# Patient Record
Sex: Male | Born: 1955 | Race: White | Hispanic: No | Marital: Single | Smoking: Former smoker
Health system: Southern US, Community
[De-identification: ages and names within clinical notes are randomized; demographics above are authoritative.]

## PROBLEM LIST (undated history)

## (undated) DIAGNOSIS — M1990 Unspecified osteoarthritis, unspecified site: Secondary | ICD-10-CM

## (undated) DIAGNOSIS — J309 Allergic rhinitis, unspecified: Secondary | ICD-10-CM

## (undated) DIAGNOSIS — K219 Gastro-esophageal reflux disease without esophagitis: Secondary | ICD-10-CM

## (undated) DIAGNOSIS — J45909 Unspecified asthma, uncomplicated: Secondary | ICD-10-CM

## (undated) DIAGNOSIS — G5601 Carpal tunnel syndrome, right upper limb: Secondary | ICD-10-CM

## (undated) DIAGNOSIS — L309 Dermatitis, unspecified: Secondary | ICD-10-CM

## (undated) DIAGNOSIS — L509 Urticaria, unspecified: Secondary | ICD-10-CM

## (undated) DIAGNOSIS — F32A Depression, unspecified: Secondary | ICD-10-CM

## (undated) DIAGNOSIS — M5126 Other intervertebral disc displacement, lumbar region: Secondary | ICD-10-CM

## (undated) DIAGNOSIS — F329 Major depressive disorder, single episode, unspecified: Secondary | ICD-10-CM

## (undated) HISTORY — PX: ANKLE SURGERY: SHX546

## (undated) HISTORY — PX: CHOLECYSTECTOMY: SHX55

## (undated) HISTORY — DX: Depression, unspecified: F32.A

## (undated) HISTORY — DX: Unspecified asthma, uncomplicated: J45.909

## (undated) HISTORY — DX: Major depressive disorder, single episode, unspecified: F32.9

## (undated) HISTORY — DX: Allergic rhinitis, unspecified: J30.9

## (undated) HISTORY — DX: Other intervertebral disc displacement, lumbar region: M51.26

## (undated) HISTORY — DX: Urticaria, unspecified: L50.9

## (undated) HISTORY — DX: Gastro-esophageal reflux disease without esophagitis: K21.9

---

## 2001-01-05 ENCOUNTER — Ambulatory Visit (HOSPITAL_BASED_OUTPATIENT_CLINIC_OR_DEPARTMENT_OTHER): Admission: RE | Admit: 2001-01-05 | Discharge: 2001-01-05 | Payer: Self-pay | Admitting: *Deleted

## 2002-02-05 ENCOUNTER — Encounter: Payer: Self-pay | Admitting: Family Medicine

## 2002-02-05 ENCOUNTER — Encounter: Admission: RE | Admit: 2002-02-05 | Discharge: 2002-02-05 | Payer: Self-pay | Admitting: Family Medicine

## 2005-06-10 ENCOUNTER — Ambulatory Visit: Payer: Self-pay

## 2010-09-26 ENCOUNTER — Emergency Department (HOSPITAL_COMMUNITY): Payer: PRIVATE HEALTH INSURANCE

## 2010-09-26 ENCOUNTER — Ambulatory Visit (HOSPITAL_COMMUNITY)
Admission: EM | Admit: 2010-09-26 | Discharge: 2010-09-27 | Discharge status: Against Medical Advice | Payer: PRIVATE HEALTH INSURANCE | Attending: Emergency Medicine | Admitting: Emergency Medicine

## 2010-09-26 DIAGNOSIS — IMO0002 Reserved for concepts with insufficient information to code with codable children: Secondary | ICD-10-CM | POA: Insufficient documentation

## 2010-09-26 DIAGNOSIS — K449 Diaphragmatic hernia without obstruction or gangrene: Secondary | ICD-10-CM | POA: Insufficient documentation

## 2010-09-26 DIAGNOSIS — T18108A Unspecified foreign body in esophagus causing other injury, initial encounter: Secondary | ICD-10-CM | POA: Insufficient documentation

## 2010-09-26 LAB — DIFFERENTIAL
Basophils Absolute: 0 10*3/uL (ref 0.0–0.1)
Basophils Relative: 0 % (ref 0–1)
Eosinophils Absolute: 0.1 10*3/uL (ref 0.0–0.7)
Eosinophils Relative: 1 % (ref 0–5)
Lymphocytes Relative: 9 % — ABNORMAL LOW (ref 12–46)
Lymphs Abs: 1 10*3/uL (ref 0.7–4.0)
Monocytes Absolute: 0.5 10*3/uL (ref 0.1–1.0)
Monocytes Relative: 5 % (ref 3–12)
Neutro Abs: 9.7 10*3/uL — ABNORMAL HIGH (ref 1.7–7.7)
Neutrophils Relative %: 86 % — ABNORMAL HIGH (ref 43–77)

## 2010-09-26 LAB — URINE MICROSCOPIC-ADD ON

## 2010-09-26 LAB — URINALYSIS, ROUTINE W REFLEX MICROSCOPIC
Glucose, UA: NEGATIVE mg/dL
Ketones, ur: 15 mg/dL — AB
Leukocytes, UA: NEGATIVE
Nitrite: NEGATIVE
Protein, ur: NEGATIVE mg/dL
Specific Gravity, Urine: 1.028 (ref 1.005–1.030)
Urobilinogen, UA: 0.2 mg/dL (ref 0.0–1.0)
pH: 5 (ref 5.0–8.0)

## 2010-09-26 LAB — CBC
HCT: 44.3 % (ref 39.0–52.0)
Hemoglobin: 15.5 g/dL (ref 13.0–17.0)
MCH: 30.2 pg (ref 26.0–34.0)
MCHC: 35 g/dL (ref 30.0–36.0)
MCV: 86.2 fL (ref 78.0–100.0)
Platelets: 262 10*3/uL (ref 150–400)
RBC: 5.14 MIL/uL (ref 4.22–5.81)
RDW: 12.8 % (ref 11.5–15.5)
WBC: 11.3 10*3/uL — ABNORMAL HIGH (ref 4.0–10.5)

## 2010-09-26 LAB — COMPREHENSIVE METABOLIC PANEL
Albumin: 4.4 g/dL (ref 3.5–5.2)
Alkaline Phosphatase: 77 U/L (ref 39–117)
BUN: 26 mg/dL — ABNORMAL HIGH (ref 6–23)
Calcium: 9.5 mg/dL (ref 8.4–10.5)
Glucose, Bld: 98 mg/dL (ref 70–99)
Potassium: 3.8 mEq/L (ref 3.5–5.1)
Sodium: 139 mEq/L (ref 135–145)
Total Protein: 8.3 g/dL (ref 6.0–8.3)

## 2010-09-26 LAB — LIPASE, BLOOD: Lipase: 17 U/L (ref 11–59)

## 2010-09-29 NOTE — Op Note (Signed)
NAMEJEZIEL, HOFFMANN NO.:  [id number]  MEDICAL RECORD NO.:  [id number]           PATIENT TYPE:  E  LOCATION:  [facility]                         FACILITY:  [facility]  PHYSICIAN:  Petra Kuba, M.D.    DATE OF BIRTH:  [birth year]  DATE OF PROCEDURE:  09/26/2010 DATE OF DISCHARGE:                              OPERATIVE REPORT   SURGEON:  Petra Kuba, MD  PROCEDURE:  EGD with food disimpaction.  INDICATION:  Obvious food impaction.  Consent was signed after risks, benefits, methods, options thoroughly discussed with the patient and his wife.  MEDICINES USED: 1. Fentanyl 100 mcg. 2. Versed 10 mg.  PROCEDURE:  The videoendoscope was inserted by direct vision, obvious food was seen in the distal esophagus.  We initially snared the food bolus in multiple places, but in trying to tighten it and remove it, we just cut through it, seemed to be fairly crumbly and by his history, he has been in for 24 hours.  After multiple snares and cutting through, we went ahead and advanced the tripod grabbers and again good break food, but could not grab any pieces.  Finally, we seems to get a small piece fairly close to the GE junction and this was removed by withdrawing up to the scope and suctioning onto the scope.  With the scope, a small piece was removed.  When we inserted the scope, there was some food debris in the esophagus, but the GE junction was patent.  There was some spasm at the GE junction with a small hiatal hernia and a widely patent ring was seen.  The head of his bed was elevated and the rest of the food was easily washed into the stomach.  Water was suctioned from the stomach.  The scope was advanced through a normal antrum, normal pylorus into a normal duodenal bulb around the C-loop to a normal second portion of the duodenum.  Scope was withdrawn back to the stomach.  We did not inflate it completely with air but on quick retroflexion, no obvious cardiac lesion  was seen and no blood was seen coming from above.  Very quick straight visualization of the stomach without air insufflation, did not reveal any obvious problems, multiple food boluses and pieces were seen in the stomach.  At this juncture, the esophagus was normal except for the hiatal hernia and the widely patent ring.  There was no food left in the esophagus.  There was no obvious trauma signs or bleeding or tears.  The scope was removed.  The patient tolerated the procedure well.  There was no obvious immediate complication.  ENDOSCOPIC DIAGNOSES: 1. Obvious food impaction. 2. Broke the food impaction with multiple snares and tripod grabbers     into small pieces with one small piece being removed. 3. Once broken, all passed into the stomach. 4. He did have a small hiatal hernia with some gastroesophageal     junction spasm and a widely patent ring. 5. Otherwise, no obvious findings and exam to the second portion of     the duodenum without maximum air insufflation.  PLAN:  B.i.d. pump inhibitors.  No aspirin or nonsteroidals for 3 days. Clear liquids for 12 hours and then soft solids for 24.  Follow up p.r.n. or in 2 weeks to recheck symptoms and then decide if the dilation is needed.  Have him call us sooner p.r.n.          ______________________________ Petra Kuba, M.D.     MEM/MEDQ  D:  09/27/2010  T:  09/27/2010  Job:  [id number]  Electronically Signed by Vida Rigger M.D. on 09/29/2010 01:29:33 PM

## 2010-10-12 ENCOUNTER — Other Ambulatory Visit: Payer: Self-pay | Admitting: Gastroenterology

## 2010-10-13 ENCOUNTER — Ambulatory Visit
Admission: RE | Admit: 2010-10-13 | Discharge: 2010-10-13 | Disposition: A | Discharge status: Home / Self Care | Payer: PRIVATE HEALTH INSURANCE | Source: Ambulatory Visit | Attending: Gastroenterology | Admitting: Gastroenterology

## 2010-10-13 MED ORDER — IOHEXOL 300 MG/ML  SOLN
100.0000 mL | Freq: Once | INTRAMUSCULAR | Status: AC | PRN
  Administered 2010-10-13: 100 mL via INTRAVENOUS

## 2011-03-01 ENCOUNTER — Other Ambulatory Visit: Payer: Self-pay | Admitting: Family Medicine

## 2011-03-01 DIAGNOSIS — R911 Solitary pulmonary nodule: Secondary | ICD-10-CM

## 2011-04-18 ENCOUNTER — Ambulatory Visit
Admission: RE | Admit: 2011-04-18 | Discharge: 2011-04-18 | Disposition: A | Discharge status: Home / Self Care | Payer: PRIVATE HEALTH INSURANCE | Source: Ambulatory Visit | Attending: Family Medicine | Admitting: Family Medicine

## 2011-04-18 DIAGNOSIS — R911 Solitary pulmonary nodule: Secondary | ICD-10-CM

## 2011-04-18 MED ORDER — IOHEXOL 300 MG/ML  SOLN
75.0000 mL | Freq: Once | INTRAMUSCULAR | Status: AC | PRN
  Administered 2011-04-18: 75 mL via INTRAVENOUS

## 2012-09-17 ENCOUNTER — Other Ambulatory Visit: Payer: Self-pay | Admitting: Physician Assistant

## 2012-09-17 DIAGNOSIS — R911 Solitary pulmonary nodule: Secondary | ICD-10-CM

## 2012-10-04 ENCOUNTER — Other Ambulatory Visit: Payer: PRIVATE HEALTH INSURANCE

## 2013-10-02 ENCOUNTER — Other Ambulatory Visit: Payer: Self-pay | Admitting: Physician Assistant

## 2013-10-02 DIAGNOSIS — R911 Solitary pulmonary nodule: Secondary | ICD-10-CM

## 2013-10-07 ENCOUNTER — Encounter (INDEPENDENT_AMBULATORY_CARE_PROVIDER_SITE_OTHER): Payer: Self-pay

## 2013-10-07 ENCOUNTER — Ambulatory Visit
Admission: RE | Admit: 2013-10-07 | Discharge: 2013-10-07 | Disposition: A | Discharge status: Home / Self Care | Payer: 59 | Source: Ambulatory Visit | Attending: Physician Assistant | Admitting: Physician Assistant

## 2013-10-07 DIAGNOSIS — R911 Solitary pulmonary nodule: Secondary | ICD-10-CM

## 2013-10-07 MED ORDER — IOHEXOL 300 MG/ML  SOLN
75.0000 mL | Freq: Once | INTRAMUSCULAR | Status: AC | PRN
  Administered 2013-10-07: 75 mL via INTRAVENOUS

## 2015-01-08 DIAGNOSIS — K219 Gastro-esophageal reflux disease without esophagitis: Secondary | ICD-10-CM | POA: Insufficient documentation

## 2015-01-08 DIAGNOSIS — H1010 Acute atopic conjunctivitis, unspecified eye: Secondary | ICD-10-CM | POA: Insufficient documentation

## 2015-01-08 DIAGNOSIS — J309 Allergic rhinitis, unspecified: Secondary | ICD-10-CM | POA: Insufficient documentation

## 2015-01-08 DIAGNOSIS — L209 Atopic dermatitis, unspecified: Secondary | ICD-10-CM | POA: Insufficient documentation

## 2015-01-08 DIAGNOSIS — J4540 Moderate persistent asthma, uncomplicated: Secondary | ICD-10-CM | POA: Insufficient documentation

## 2015-01-08 DIAGNOSIS — L500 Allergic urticaria: Secondary | ICD-10-CM | POA: Insufficient documentation

## 2015-01-09 ENCOUNTER — Other Ambulatory Visit: Payer: Self-pay

## 2015-01-09 MED ORDER — MEPOLIZUMAB 100 MG ~~LOC~~ SOLR
300.0000 mg | SUBCUTANEOUS | Status: DC
  Administered 2015-02-27: 300 mg via SUBCUTANEOUS

## 2015-01-30 ENCOUNTER — Ambulatory Visit (INDEPENDENT_AMBULATORY_CARE_PROVIDER_SITE_OTHER): Payer: Commercial Managed Care - HMO | Admitting: *Deleted

## 2015-01-30 DIAGNOSIS — J309 Allergic rhinitis, unspecified: Secondary | ICD-10-CM

## 2015-01-30 DIAGNOSIS — L501 Idiopathic urticaria: Secondary | ICD-10-CM

## 2015-01-30 MED ORDER — OMALIZUMAB 150 MG ~~LOC~~ SOLR
300.0000 mg | SUBCUTANEOUS | Status: DC
  Administered 2015-01-30 – 2015-04-03 (×2): 300 mg via SUBCUTANEOUS

## 2015-02-27 ENCOUNTER — Ambulatory Visit (INDEPENDENT_AMBULATORY_CARE_PROVIDER_SITE_OTHER): Payer: Commercial Managed Care - HMO

## 2015-02-27 DIAGNOSIS — L500 Allergic urticaria: Secondary | ICD-10-CM | POA: Diagnosis not present

## 2015-04-02 ENCOUNTER — Ambulatory Visit: Payer: Commercial Managed Care - HMO | Admitting: Allergy and Immunology

## 2015-04-03 ENCOUNTER — Encounter: Payer: Self-pay | Admitting: Allergy and Immunology

## 2015-04-03 ENCOUNTER — Ambulatory Visit (INDEPENDENT_AMBULATORY_CARE_PROVIDER_SITE_OTHER): Payer: Commercial Managed Care - HMO | Admitting: Allergy and Immunology

## 2015-04-03 VITALS — BP 112/92 | HR 84 | Resp 14 | Ht 68.9 in | Wt 180.8 lb

## 2015-04-03 DIAGNOSIS — J4540 Moderate persistent asthma, uncomplicated: Secondary | ICD-10-CM

## 2015-04-03 DIAGNOSIS — J309 Allergic rhinitis, unspecified: Secondary | ICD-10-CM | POA: Diagnosis not present

## 2015-04-03 DIAGNOSIS — H1010 Acute atopic conjunctivitis, unspecified eye: Secondary | ICD-10-CM | POA: Diagnosis not present

## 2015-04-03 DIAGNOSIS — K219 Gastro-esophageal reflux disease without esophagitis: Secondary | ICD-10-CM

## 2015-04-03 DIAGNOSIS — L500 Allergic urticaria: Secondary | ICD-10-CM

## 2015-04-03 DIAGNOSIS — L209 Atopic dermatitis, unspecified: Secondary | ICD-10-CM

## 2015-04-03 MED ORDER — OMALIZUMAB 150 MG ~~LOC~~ SOLR
300.0000 mg | SUBCUTANEOUS | Status: DC
  Administered 2015-05-14 – 2016-06-01 (×14): 300 mg via SUBCUTANEOUS

## 2015-04-03 MED ORDER — METHYLPREDNISOLONE ACETATE 80 MG/ML IJ SUSP
80.0000 mg | Freq: Once | INTRAMUSCULAR | Status: AC
  Administered 2015-04-03: 80 mg via INTRAMUSCULAR

## 2015-04-03 NOTE — Progress Notes (Signed)
[facility]  Follow-up Note  Refering Provider: No ref. provider found Primary Provider: Arlyss Queen  Subjective:   Douglas Carroll is a 59 y.o. male who [facility] in re-evaluation of the following:  HPI Comments:  Douglas Carroll returns to this clinic on Tuesday December 2016 in reevaluation of his multiorgan atopic disease. The big issue for Douglas Carroll is that over the course of the past week if not a little bit longer he's had a dramatic flare of his skin condition. He has red blotchy areas over his body that are intensely itchy and he thinks his eczema starting to flare. He has no problems with his chest. Specifically he has no wheezing or coughing or shortness of breath and does not need to use a bronchodilator. He has no problems with his nose area reflux is not been a problem. He is not using his [place] and does not use a nasal steroid but has been pretty consistent about using Protopic to areas of eczema and he continues on Xolair. He's had 3 injections of Xolair so far but he does not think that he continue to use this medication based upon the cost of the injection. He does have a co-pay plan to pay for the medication and it costs him $5 per injection but the administration fee is quite high.   Outpatient Encounter Prescriptions as of 04/03/2015  Medication Sig  . albuterol (VENTOLIN HFA) 108 (90 BASE) MCG/ACT inhaler Inhale 2 puffs into the lungs every 4 (four) hours as needed for wheezing or shortness of breath.  . EPINEPHrine (EPIPEN 2-PAK) 0.3 mg/0.3 mL IJ SOAJ injection Inject 0.3 mg into the muscle once.  . loratadine (CLARITIN) 10 MG tablet Take 10 mg by mouth 2 (two) times daily.  . mometasone (ELOCON) 0.1 % ointment Apply 1 application topically daily as needed.  . montelukast (SINGULAIR) 10 MG tablet Take 10 mg by mouth at bedtime.  Marland Kitchen omalizumab (XOLAIR) 150 MG injection Inject 300 mg into the  skin every 28 (twenty-eight) days.  Marland Kitchen omeprazole (PRILOSEC) 20 MG capsule Take 20 mg by mouth daily.  . pravastatin (PRAVACHOL) 80 MG tablet Take 80 mg by mouth daily.  . sertraline (ZOLOFT) 50 MG tablet   . budesonide (RHINOCORT ALLERGY) 32 MCG/ACT nasal spray Place into both nostrils daily.  . Fluticasone Furoate-Vilanterol (BREO ELLIPTA) 100-25 MCG/INH AEPB Inhale 1 puff into the lungs daily.   Facility-Administered Encounter Medications as of 04/03/2015  Medication  . [COMPLETED] methylPREDNISolone acetate (DEPO-MEDROL) injection 80 mg  . omalizumab Geoffry Paradise) injection 300 mg  . [DISCONTINUED] Mepolizumab SOLR 300 mg  . [DISCONTINUED] omalizumab Geoffry Paradise) injection 300 mg    Meds ordered this encounter  Medications  . methylPREDNISolone acetate (DEPO-MEDROL) injection 80 mg    Sig:   . omalizumab Geoffry Paradise) injection 300 mg    Sig:     Past Medical History  Diagnosis Date  . Asthma   . Urticaria   . Depression   . GERD (gastroesophageal reflux disease)   . Allergic rhinitis     Past Surgical History  Procedure Laterality Date  . Cholecystectomy      No Known Allergies  Review of Systems  Constitutional: Negative for fever, chills and fatigue.  HENT: Negative for congestion, ear pain, facial swelling, hearing loss, nosebleeds, postnasal drip, rhinorrhea, sinus pressure, sneezing, sore throat, tinnitus, trouble swallowing and voice change.   Eyes: Negative for pain, discharge, redness and  itching.  Respiratory: Negative for cough, chest tightness, shortness of breath and wheezing.   Cardiovascular: Negative for chest pain and leg swelling.  Gastrointestinal: Negative for nausea, vomiting and abdominal pain.  Endocrine: Negative for cold intolerance and heat intolerance.  Musculoskeletal: Negative for myalgias and arthralgias.  Skin: Positive for rash.  Allergic/Immunologic: Negative.   Neurological: Negative for dizziness and headaches.  Hematological: Negative for  adenopathy.     Objective:   Filed Vitals:   04/03/15 0837  BP: 112/92  Pulse: 84  Resp: 14   Height: 5' 8.9" (175 cm)  Weight: 180 lb 12.4 oz (82 kg)   Physical Exam  Constitutional: He appears well-developed and well-nourished. No distress.  HENT:  Head: Normocephalic and atraumatic. Head is without right periorbital erythema and without left periorbital erythema.  Right Ear: Tympanic membrane, external ear and ear canal normal. No drainage or tenderness. No foreign bodies. Tympanic membrane is not injected, not scarred, not perforated, not erythematous, not retracted and not bulging. No middle ear effusion.  Left Ear: Tympanic membrane, external ear and ear canal normal. No drainage or tenderness. No foreign bodies. Tympanic membrane is not injected, not scarred, not perforated, not erythematous, not retracted and not bulging.  No middle ear effusion.  Nose: Nose normal. No mucosal edema, rhinorrhea, nose lacerations or sinus tenderness.  No foreign bodies.  Mouth/Throat: Oropharynx is clear and moist. No oropharyngeal exudate, posterior oropharyngeal edema, posterior oropharyngeal erythema or tonsillar abscesses.  Eyes: Lids are normal. Right eye exhibits no chemosis, no discharge and no exudate. No foreign body present in the right eye. Left eye exhibits no chemosis, no discharge and no exudate. No foreign body present in the left eye. Right conjunctiva is not injected. Left conjunctiva is not injected.  Neck: Neck supple. No tracheal tenderness present. No tracheal deviation and no edema present. No thyroid mass and no thyromegaly present.  Cardiovascular: Normal rate, regular rhythm, S1 normal and S2 normal.  Exam reveals no gallop.   No murmur heard. Pulmonary/Chest: No accessory muscle usage or stridor. No respiratory distress. He has no wheezes. He has no rhonchi. He has no rales.  Abdominal: Soft.  Lymphadenopathy:       Head (right side): No tonsillar adenopathy present.        Head (left side): No tonsillar adenopathy present.    He has no cervical adenopathy.  Neurological: He is alert.  Skin: Rash noted. He is not diaphoretic.  Extensive erythematous blotchy lesions across his trunk and extremities and face along with lichenified and erythematous areas of his skin involving his antecubital fossa and neck.  Psychiatric: He has a normal mood and affect. His behavior is normal.    Diagnostics:   The patient had an Asthma Control Test with the following results: ACT Total Score: 21.    Assessment and Plan:   1. Moderate persistent asthma, uncomplicated   2. Allergic rhinoconjunctivitis   3. Atopic dermatitis   4. Allergic urticaria   5. Gastroesophageal reflux disease, esophagitis presence not specified      1. Depomedrol 80 IM now  2. Prednisone 10mg  tablet - two tablets one time a day for 10 days, then 1-1/2 tablet one time per day for 10 days, then one tablet one time a day for 10 days, then 1/2 tablet one time per day for 10 days.  3. Use a combination of mometasone 0.1% cream followed by Protopic 0.1% ointment two times per day.  4. Use Zyrtec 10 mg one tablet  two times per day  5. Continue Breo 100 one inhalation two times per day  6. Continue montelukast 10 mg one tablet one time per day  7. Continue omeprazole 20 mg one tablet one time per day  8. Continue OTC rhinocort one spray each nostril one time per day  9. Use Ventolin if needed.  10. Xolair today - no charge for injection delivery today  11. Return at end of prednisone.  [name]'s had a dramatic flare of his skin condition and we will treat him with the therapy mentioned above and regroup with him when he finishes his oral steroid. We did have a dose of his Xolair in the clinic today and I did administer this medication and we will not charge him for the injection delivery fee today. It did appear as though the administration of his Xolair was keeping his atopic disease under good  control.   Laurette Schimke, MD [facility]

## 2015-04-03 NOTE — Patient Instructions (Addendum)
  1. Depomedrol 80 IM now  2. Prednisone 10mg  tablet - two tablets one time a day for 10 days, then 1-1/2 tablet one time per day for 10 days, then one tablet one time a day for 10 days, then 1/2 tablet one time per day for 10 days.  3. Use a combination of mometasone o.1% cream followed by Protopic 0.1% ointment two times per day.  4. Use Zyrtec 10 mg one tablet two times per day  5. Continue Breo 100 one inhalation two times per day  6. Continue montelukast 10 mg one tablet one time per day  7. Continue omeprazole 20 mg one tablet one time per day  8. Continue OTC rhinocort one spray each nostril one time per day  9. Use Ventolin if needed.  10. Xolair today - no charge for injection delivery today  11. Return at end of prednisone.

## 2015-04-13 ENCOUNTER — Ambulatory Visit: Payer: Self-pay | Admitting: Allergy and Immunology

## 2015-05-14 ENCOUNTER — Encounter: Payer: Self-pay | Admitting: Allergy and Immunology

## 2015-05-14 ENCOUNTER — Ambulatory Visit (INDEPENDENT_AMBULATORY_CARE_PROVIDER_SITE_OTHER): Payer: Commercial Managed Care - HMO | Admitting: Allergy and Immunology

## 2015-05-14 VITALS — BP 130/84 | HR 92 | Resp 18

## 2015-05-14 DIAGNOSIS — L209 Atopic dermatitis, unspecified: Secondary | ICD-10-CM

## 2015-05-14 DIAGNOSIS — H1010 Acute atopic conjunctivitis, unspecified eye: Secondary | ICD-10-CM

## 2015-05-14 DIAGNOSIS — K219 Gastro-esophageal reflux disease without esophagitis: Secondary | ICD-10-CM | POA: Diagnosis not present

## 2015-05-14 DIAGNOSIS — L500 Allergic urticaria: Secondary | ICD-10-CM

## 2015-05-14 DIAGNOSIS — J4540 Moderate persistent asthma, uncomplicated: Secondary | ICD-10-CM

## 2015-05-14 DIAGNOSIS — J309 Allergic rhinitis, unspecified: Secondary | ICD-10-CM | POA: Diagnosis not present

## 2015-05-14 NOTE — Patient Instructions (Addendum)
    1. Use a combination of mometasone o.1% cream followed by Protopic 0.1% ointment two times per day.  2. Use Zyrtec 10 mg one tablet two times per day  3. Continue Breo 100 one inhalation two times per day  4. Continue montelukast 10 mg one tablet one time per day  5. Continue omeprazole 20 mg one tablet one time per day  6. Continue OTC rhinocort one spray each nostril one time per day  7. Continue Xolair and EpiPen  8. Use Ventolin if needed.  9. Return in 6 months or earlier if problem

## 2015-05-14 NOTE — Progress Notes (Signed)
[facility]  Follow-up Note  Referring Provider: Lonie Peak, PA-C Primary Provider: Arlyss Queen Date of Office Visit: 05/14/2015  Subjective:   Douglas Carroll is a 60 y.o. male who [facility] the [facility] in re-evaluation of the following:  HPI Comments:  Douglas Carroll returns to this clinic on 05/14/2015 in reevaluation of his atopic dermatitis, allergic rhinitis, urticaria, and asthma. He is done quite well since I last seen him in this clinic and has resolved his flare. He no longer has much issue with his skin. He's not been consistently using his topical anti-inflammatory agents. His asthma has not been causing him any problem and he does not use a short acting bronchodilator. He's had no problems with his nose. He's been very good about continuing to use all of his previously prescribed medical therapy.   Current Outpatient Prescriptions on File Prior to Visit  Medication Sig Dispense Refill  . albuterol (VENTOLIN HFA) 108 (90 BASE) MCG/ACT inhaler Inhale 2 puffs into the lungs every 4 (four) hours as needed for wheezing or shortness of breath.    . EPINEPHrine (EPIPEN 2-PAK) 0.3 mg/0.3 mL IJ SOAJ injection Inject 0.3 mg into the muscle once.    . Fluticasone Furoate-Vilanterol (BREO ELLIPTA) 100-25 MCG/INH AEPB Inhale 1 puff into the lungs daily.    Marland Kitchen omalizumab (XOLAIR) 150 MG injection Inject 300 mg into the skin every 28 (twenty-eight) days.    Marland Kitchen omeprazole (PRILOSEC) 20 MG capsule Take 20 mg by mouth daily.    . pravastatin (PRAVACHOL) 80 MG tablet Take 80 mg by mouth daily.    . budesonide (RHINOCORT ALLERGY) 32 MCG/ACT nasal spray Place into both nostrils daily. Reported on 05/14/2015    . loratadine (CLARITIN) 10 MG tablet Take 10 mg by mouth 2 (two) times daily. Reported on 05/14/2015    . mometasone (ELOCON) 0.1 % ointment Apply 1 application topically daily as needed. Reported on 05/14/2015    .  montelukast (SINGULAIR) 10 MG tablet Take 10 mg by mouth at bedtime. Reported on 05/14/2015    . sertraline (ZOLOFT) 50 MG tablet Reported on 05/14/2015  1   Current Facility-Administered Medications on File Prior to Visit  Medication Dose Route Frequency Provider Last Rate Last Dose  . omalizumab Geoffry Paradise) injection 300 mg  300 mg Subcutaneous Q28 days Jessica Priest, MD        No orders of the defined types were placed in this encounter.    Past Medical History  Diagnosis Date  . Asthma   . Urticaria   . Depression   . GERD (gastroesophageal reflux disease)   . Allergic rhinitis     Past Surgical History  Procedure Laterality Date  . Cholecystectomy      No Known Allergies  Review of systems negative except as noted in HPI / PMHx or noted below:  Review of Systems  Constitutional: Negative.   HENT: Negative.   Eyes: Negative.   Respiratory: Negative.   Cardiovascular: Negative.   Gastrointestinal: Negative.   Genitourinary: Negative.   Musculoskeletal: Negative.   Skin: Negative.   Neurological: Negative.   Endo/Heme/Allergies: Negative.   Psychiatric/Behavioral: Negative.      Objective:   Filed Vitals:   05/14/15 1558  BP: 130/84  Pulse: 92  Resp: 18          Physical Exam  Constitutional: He is well-developed, well-nourished, and in no distress. No distress.  HENT:  Head:  Normocephalic.  Right Ear: Tympanic membrane, external ear and ear canal normal.  Left Ear: Tympanic membrane, external ear and ear canal normal.  Nose: Nose normal. No mucosal edema or rhinorrhea.  Mouth/Throat: Uvula is midline, oropharynx is clear and moist and mucous membranes are normal. No oropharyngeal exudate.  Eyes: Conjunctivae are normal.  Neck: Trachea normal. No tracheal tenderness present. No tracheal deviation present. No thyromegaly present.  Cardiovascular: Normal rate, regular rhythm, S1 normal, S2 normal and normal heart sounds.   No murmur  heard. Pulmonary/Chest: Breath sounds normal. No stridor. No respiratory distress. He has no wheezes. He has no rales.  Musculoskeletal: He exhibits no edema.  Lymphadenopathy:       Head (right side): No tonsillar adenopathy present.       Head (left side): No tonsillar adenopathy present.    He has no cervical adenopathy.    He has no axillary adenopathy.  Neurological: He is alert. Gait normal.  Skin: Rash (minimal erythema face and upper extremities) noted. He is not diaphoretic. No erythema. Nails show no clubbing.  Psychiatric: Mood and affect normal.    Diagnostics:    Spirometry was performed and demonstrated an FEV1 of 3.58 at 102 % of predicted.  The patient had an Asthma Control Test with the following results:  .    Assessment and Plan:   1. Moderate persistent asthma, uncomplicated   2. Allergic rhinoconjunctivitis   3. Atopic dermatitis   4. Allergic urticaria   5. Gastroesophageal reflux disease, esophagitis presence not specified       1. Use a combination of mometasone o.1% cream followed by Protopic 0.1% ointment two times per day.  2. Use Zyrtec 10 mg one tablet two times per day  3. Continue Breo 100 one inhalation two times per day  4. Continue montelukast 10 mg one tablet one time per day  5. Continue omeprazole 20 mg one tablet one time per day  6. Continue OTC rhinocort one spray each nostril one time per day  7. Continue Xolair and EpiPen  8. Use Ventolin if needed.  9. Return in 6 months or earlier if problem  Douglas HeckRandy has done well I encouraged him to consistently use his mometasone and Protopic especially if he finds any areas of his skin that are becoming inflamed. We'll continue to have him use the therapy mentioned above and see him back in this clinic in approximately 6 months or earlier if there is a problem.   Laurette SchimkeEric Tymara Saur, MD [facility]

## 2015-06-02 ENCOUNTER — Ambulatory Visit (INDEPENDENT_AMBULATORY_CARE_PROVIDER_SITE_OTHER): Payer: Commercial Managed Care - HMO

## 2015-06-02 ENCOUNTER — Ambulatory Visit: Payer: Commercial Managed Care - HMO | Admitting: Allergy and Immunology

## 2015-06-02 DIAGNOSIS — L500 Allergic urticaria: Secondary | ICD-10-CM | POA: Diagnosis not present

## 2015-06-02 DIAGNOSIS — J4540 Moderate persistent asthma, uncomplicated: Secondary | ICD-10-CM

## 2015-06-18 ENCOUNTER — Telehealth: Payer: Self-pay | Admitting: Allergy and Immunology

## 2015-06-18 NOTE — Telephone Encounter (Signed)
Please call patient back about payment that he wished to apply to a specific date and a payment he claims to have made "online" in the amount of $183.00.

## 2015-06-18 NOTE — Telephone Encounter (Signed)
Just sent pmt 2 days ago - gave me another payment to pay off acct

## 2015-06-30 ENCOUNTER — Ambulatory Visit (INDEPENDENT_AMBULATORY_CARE_PROVIDER_SITE_OTHER): Payer: Commercial Managed Care - HMO | Admitting: *Deleted

## 2015-06-30 DIAGNOSIS — J4540 Moderate persistent asthma, uncomplicated: Secondary | ICD-10-CM

## 2015-07-28 ENCOUNTER — Ambulatory Visit (INDEPENDENT_AMBULATORY_CARE_PROVIDER_SITE_OTHER): Payer: Commercial Managed Care - HMO

## 2015-07-28 DIAGNOSIS — L501 Idiopathic urticaria: Secondary | ICD-10-CM | POA: Diagnosis not present

## 2015-07-28 DIAGNOSIS — J4540 Moderate persistent asthma, uncomplicated: Secondary | ICD-10-CM

## 2015-08-28 ENCOUNTER — Ambulatory Visit (INDEPENDENT_AMBULATORY_CARE_PROVIDER_SITE_OTHER): Payer: Commercial Managed Care - HMO | Admitting: *Deleted

## 2015-08-28 DIAGNOSIS — L501 Idiopathic urticaria: Secondary | ICD-10-CM | POA: Diagnosis not present

## 2015-08-28 DIAGNOSIS — J4540 Moderate persistent asthma, uncomplicated: Secondary | ICD-10-CM

## 2015-08-28 DIAGNOSIS — L500 Allergic urticaria: Secondary | ICD-10-CM

## 2015-09-25 ENCOUNTER — Ambulatory Visit (INDEPENDENT_AMBULATORY_CARE_PROVIDER_SITE_OTHER): Payer: Commercial Managed Care - HMO | Admitting: *Deleted

## 2015-09-25 DIAGNOSIS — L500 Allergic urticaria: Secondary | ICD-10-CM

## 2015-10-30 ENCOUNTER — Ambulatory Visit (INDEPENDENT_AMBULATORY_CARE_PROVIDER_SITE_OTHER): Payer: Commercial Managed Care - HMO | Admitting: *Deleted

## 2015-10-30 DIAGNOSIS — J4540 Moderate persistent asthma, uncomplicated: Secondary | ICD-10-CM | POA: Diagnosis not present

## 2015-10-30 DIAGNOSIS — L501 Idiopathic urticaria: Secondary | ICD-10-CM

## 2015-11-11 ENCOUNTER — Ambulatory Visit: Payer: Commercial Managed Care - HMO | Admitting: Allergy and Immunology

## 2015-11-27 ENCOUNTER — Ambulatory Visit (INDEPENDENT_AMBULATORY_CARE_PROVIDER_SITE_OTHER): Payer: Commercial Managed Care - HMO | Admitting: *Deleted

## 2015-11-27 DIAGNOSIS — L500 Allergic urticaria: Secondary | ICD-10-CM

## 2015-12-25 ENCOUNTER — Ambulatory Visit (INDEPENDENT_AMBULATORY_CARE_PROVIDER_SITE_OTHER): Payer: Commercial Managed Care - HMO

## 2015-12-25 ENCOUNTER — Ambulatory Visit: Payer: Commercial Managed Care - HMO

## 2015-12-25 DIAGNOSIS — L500 Allergic urticaria: Secondary | ICD-10-CM | POA: Diagnosis not present

## 2016-01-22 ENCOUNTER — Ambulatory Visit: Payer: Commercial Managed Care - HMO

## 2016-01-22 ENCOUNTER — Ambulatory Visit (INDEPENDENT_AMBULATORY_CARE_PROVIDER_SITE_OTHER): Payer: Commercial Managed Care - HMO

## 2016-01-22 DIAGNOSIS — L501 Idiopathic urticaria: Secondary | ICD-10-CM

## 2016-01-22 DIAGNOSIS — J4540 Moderate persistent asthma, uncomplicated: Secondary | ICD-10-CM

## 2016-02-25 ENCOUNTER — Ambulatory Visit (INDEPENDENT_AMBULATORY_CARE_PROVIDER_SITE_OTHER): Payer: Commercial Managed Care - HMO | Admitting: *Deleted

## 2016-02-25 DIAGNOSIS — J4540 Moderate persistent asthma, uncomplicated: Secondary | ICD-10-CM

## 2016-02-25 DIAGNOSIS — L501 Idiopathic urticaria: Secondary | ICD-10-CM

## 2016-03-31 ENCOUNTER — Ambulatory Visit (INDEPENDENT_AMBULATORY_CARE_PROVIDER_SITE_OTHER): Payer: Commercial Managed Care - HMO

## 2016-03-31 ENCOUNTER — Ambulatory Visit: Payer: Commercial Managed Care - HMO

## 2016-03-31 DIAGNOSIS — J4540 Moderate persistent asthma, uncomplicated: Secondary | ICD-10-CM

## 2016-03-31 DIAGNOSIS — L500 Allergic urticaria: Secondary | ICD-10-CM | POA: Diagnosis not present

## 2016-04-28 ENCOUNTER — Ambulatory Visit: Payer: Commercial Managed Care - HMO

## 2016-04-28 ENCOUNTER — Ambulatory Visit (INDEPENDENT_AMBULATORY_CARE_PROVIDER_SITE_OTHER): Payer: Commercial Managed Care - HMO | Admitting: *Deleted

## 2016-04-28 DIAGNOSIS — L500 Allergic urticaria: Secondary | ICD-10-CM | POA: Diagnosis not present

## 2016-05-26 ENCOUNTER — Ambulatory Visit: Payer: Commercial Managed Care - HMO

## 2016-05-31 DIAGNOSIS — L501 Idiopathic urticaria: Secondary | ICD-10-CM | POA: Diagnosis not present

## 2016-06-01 ENCOUNTER — Ambulatory Visit (INDEPENDENT_AMBULATORY_CARE_PROVIDER_SITE_OTHER): Payer: Commercial Managed Care - HMO | Admitting: *Deleted

## 2016-06-01 DIAGNOSIS — L500 Allergic urticaria: Secondary | ICD-10-CM

## 2016-06-06 ENCOUNTER — Ambulatory Visit (INDEPENDENT_AMBULATORY_CARE_PROVIDER_SITE_OTHER): Payer: Commercial Managed Care - HMO | Admitting: Allergy and Immunology

## 2016-06-06 ENCOUNTER — Encounter: Payer: Self-pay | Admitting: Allergy and Immunology

## 2016-06-06 VITALS — BP 122/78 | HR 82 | Resp 14

## 2016-06-06 DIAGNOSIS — J309 Allergic rhinitis, unspecified: Secondary | ICD-10-CM | POA: Diagnosis not present

## 2016-06-06 DIAGNOSIS — L2089 Other atopic dermatitis: Secondary | ICD-10-CM

## 2016-06-06 DIAGNOSIS — H1010 Acute atopic conjunctivitis, unspecified eye: Secondary | ICD-10-CM

## 2016-06-06 DIAGNOSIS — K219 Gastro-esophageal reflux disease without esophagitis: Secondary | ICD-10-CM

## 2016-06-06 DIAGNOSIS — J4540 Moderate persistent asthma, uncomplicated: Secondary | ICD-10-CM | POA: Diagnosis not present

## 2016-06-06 NOTE — Progress Notes (Signed)
Follow-up Note  Referring Provider: Lonie Peakonroy, Nathan, PA-C Primary Provider: Lonie PeakNathan Conroy, PA-C Date of Office Visit: 06/06/2016  Subjective:   Douglas LawlessRobert R Demicco (DOB: January 09, 1956) is a 61 y.o. male [facility] in re-evaluation of the following:  HPI: Douglas HeckRandy returns to this clinic in reevaluation of his multiorgan atopic disease including asthma, or urticaria, atopic dermatitis, allergic rhinitis treated with Xolair. I've not seen him in this clinic since January 2017.  His atopic disease has melted away on Xolair. He no longer uses any medications for asthma or his allergic rhinitis and rarely uses any mometasone and Protopic at this point in time for his atopic dermatitis. He has not required a systemic steroid or an antibiotic since I've seen him in this clinic. He does not use a short acting bronchodilator and can exercise without any difficulty.  Xolair injections have been doing well but they are very expensive. He pays approximately $150 per injection.  His reflux is under excellent control as long as he continues on a proton pump inhibitor.  He did receive the flu vaccine this year.  Allergies as of 06/06/2016   No Known Allergies     Medication List      EPIPEN 2-PAK 0.3 mg/0.3 mL Soaj injection Generic drug:  EPINEPHrine Inject 0.3 mg into the muscle once.   loratadine 10 MG tablet Commonly known as:  CLARITIN Take 10 mg by mouth 2 (two) times daily. Reported on 05/14/2015   mometasone 0.1 % ointment Commonly known as:  ELOCON Apply 1 application topically daily as needed. Reported on 05/14/2015   omeprazole 20 MG capsule Commonly known as:  PRILOSEC Take 20 mg by mouth daily.   pravastatin 80 MG tablet Commonly known as:  PRAVACHOL Take 80 mg by mouth daily.   sertraline 50 MG tablet Commonly known as:  ZOLOFT Reported on 05/14/2015   tacrolimus 0.1 % ointment Commonly known as:  PROTOPIC Apply 1 application  topically 2 (two) times daily as needed.   VENTOLIN HFA 108 (90 Base) MCG/ACT inhaler Generic drug:  albuterol Inhale 2 puffs into the lungs every 4 (four) hours as needed for wheezing or shortness of breath.   XOLAIR 150 MG injection Generic drug:  omalizumab Inject 300 mg into the skin every 28 (twenty-eight) days.       Past Medical History:  Diagnosis Date  . Allergic rhinitis   . Asthma   . Depression   . GERD (gastroesophageal reflux disease)   . Urticaria     Past Surgical History:  Procedure Laterality Date  . CHOLECYSTECTOMY      Review of systems negative except as noted in HPI / PMHx or noted below:  Review of Systems  Constitutional: Negative.   HENT: Negative.   Eyes: Negative.   Respiratory: Negative.   Cardiovascular: Negative.   Gastrointestinal: Negative.   Genitourinary: Negative.   Musculoskeletal: Negative.   Skin: Negative.   Neurological: Negative.   Endo/Heme/Allergies: Negative.   Psychiatric/Behavioral: Negative.      Objective:   Vitals:   06/06/16 1530  BP: 122/78  Pulse: 82  Resp: 14          Physical Exam  Constitutional: He is well-developed, well-nourished, and in no distress.  HENT:  Head: Normocephalic.  Right Ear: Tympanic membrane, external ear and ear canal normal.  Left Ear: Tympanic membrane, external ear and ear canal normal.  Nose: Nose normal. No mucosal edema or rhinorrhea.  Mouth/Throat: Uvula is midline, oropharynx is clear and moist and mucous membranes are normal. No oropharyngeal exudate.  Eyes: Conjunctivae are normal.  Neck: Trachea normal. No tracheal tenderness present. No tracheal deviation present. No thyromegaly present.  Cardiovascular: Normal rate, regular rhythm, S1 normal, S2 normal and normal heart sounds.   No murmur heard. Pulmonary/Chest: Breath sounds normal. No stridor. No respiratory distress. He has no wheezes. He has no rales.  Musculoskeletal: He exhibits no edema.    Lymphadenopathy:       Head (right side): No tonsillar adenopathy present.       Head (left side): No tonsillar adenopathy present.    He has no cervical adenopathy.  Neurological: He is alert. Gait normal.  Skin: No rash noted. He is not diaphoretic. No erythema. Nails show no clubbing.  Psychiatric: Mood and affect normal.    Diagnostics:    Spirometry was performed and demonstrated an FEV1 of 3.23 at 93 % of predicted.  The patient had an Asthma Control Test with the following results:  .    Assessment and Plan:   1. Other atopic dermatitis   2. Moderate persistent asthma, uncomplicated   3. Allergic rhinoconjunctivitis   4. Gastroesophageal reflux disease, esophagitis presence not specified      1. Use a combination of mometasone o.1% cream followed by Protopic 0.1% ointment two times per day when needed.  2. Use Zyrtec 10 mg one tablet two times per day if needed  3. Use Ventolin if needed.  4. Continue Xolair and EpiPen  5. Continue omeprazole 20 mg one tablet one time per day  6. Return in 12 months or earlier if problem  7. Possible Dupilumab to replace Xolair?   Douglas [facility] has had an excellent response to the biological agent administered for his atopic disease and we will continue to have him use Xolair at this point in time. I will see if he is a candidate for dupilumab injections to replace his Xolair as this would be cheaper for him in the long run. He can inject the dupilumab at home and he will not have a injection charge. He will also continue to use therapy described above against reflux. I'll see him back in this clinic in 12 months or earlier if there is a problem.  Laurette Schimke, MD Allergy / Immunology [facility]

## 2016-06-06 NOTE — Patient Instructions (Signed)
    1. Use a combination of mometasone o.1% cream followed by Protopic 0.1% ointment two times per day when needed.  2. Use Zyrtec 10 mg one tablet two times per day if needed  3. Use Ventolin if needed.  4. Continue Xolair and EpiPen  5. Continue omeprazole 20 mg one tablet one time per day  6. Return in 12 months or earlier if problem  7. Possible Dupilumab to replace Xolair?

## 2016-06-07 ENCOUNTER — Encounter: Payer: Self-pay | Admitting: Allergy and Immunology

## 2016-06-27 ENCOUNTER — Ambulatory Visit (INDEPENDENT_AMBULATORY_CARE_PROVIDER_SITE_OTHER): Payer: Commercial Managed Care - HMO | Admitting: *Deleted

## 2016-06-27 DIAGNOSIS — L209 Atopic dermatitis, unspecified: Secondary | ICD-10-CM | POA: Diagnosis not present

## 2016-06-27 NOTE — Progress Notes (Unsigned)
Immunotherapy   Patient Details  Name: Pecola LawlessRobert R Beirne MRN: [id number] Date of Birth: 1955/12/15  06/27/2016  Pecola Lawlessobert R Capuano started injections for  Dupixent. Following schedule: injection 600 mg on first dose then 300 mg every 14 days.  Epi-Pen:Epi-Pen Available  Consent signed and patient instructions given. Showed patient how to give himself injection. Patient waited 30 minutes after the injections with no problems.    Vella RedheadHeather Clark 06/27/2016, 3:58 PM

## 2016-06-29 ENCOUNTER — Ambulatory Visit: Payer: Self-pay

## 2016-08-30 DIAGNOSIS — Z23 Encounter for immunization: Secondary | ICD-10-CM | POA: Diagnosis not present

## 2016-08-30 DIAGNOSIS — Z0000 Encounter for general adult medical examination without abnormal findings: Secondary | ICD-10-CM | POA: Diagnosis not present

## 2016-08-30 DIAGNOSIS — M2550 Pain in unspecified joint: Secondary | ICD-10-CM | POA: Diagnosis not present

## 2016-08-30 DIAGNOSIS — Z79899 Other long term (current) drug therapy: Secondary | ICD-10-CM | POA: Diagnosis not present

## 2016-08-30 DIAGNOSIS — M79641 Pain in right hand: Secondary | ICD-10-CM | POA: Diagnosis not present

## 2016-09-21 ENCOUNTER — Encounter (HOSPITAL_COMMUNITY): Payer: Self-pay | Admitting: Emergency Medicine

## 2016-09-21 ENCOUNTER — Emergency Department (HOSPITAL_COMMUNITY): Payer: Worker's Compensation

## 2016-09-21 ENCOUNTER — Emergency Department (HOSPITAL_COMMUNITY)
Admission: EM | Admit: 2016-09-21 | Discharge: 2016-09-21 | Disposition: A | Discharge status: Home / Self Care | Payer: Worker's Compensation | Attending: Emergency Medicine | Admitting: Emergency Medicine

## 2016-09-21 DIAGNOSIS — R109 Unspecified abdominal pain: Secondary | ICD-10-CM | POA: Diagnosis not present

## 2016-09-21 DIAGNOSIS — M545 Low back pain, unspecified: Secondary | ICD-10-CM

## 2016-09-21 DIAGNOSIS — Z87891 Personal history of nicotine dependence: Secondary | ICD-10-CM | POA: Insufficient documentation

## 2016-09-21 DIAGNOSIS — J45909 Unspecified asthma, uncomplicated: Secondary | ICD-10-CM | POA: Insufficient documentation

## 2016-09-21 LAB — URINALYSIS, ROUTINE W REFLEX MICROSCOPIC
BILIRUBIN URINE: NEGATIVE
Glucose, UA: NEGATIVE mg/dL
Hgb urine dipstick: NEGATIVE
KETONES UR: NEGATIVE mg/dL
Leukocytes, UA: NEGATIVE
Nitrite: NEGATIVE
PH: 5 (ref 5.0–8.0)
PROTEIN: NEGATIVE mg/dL
Specific Gravity, Urine: 1.026 (ref 1.005–1.030)

## 2016-09-21 MED ORDER — HYDROMORPHONE HCL 1 MG/ML IJ SOLN
1.0000 mg | Freq: Once | INTRAMUSCULAR | Status: AC
  Administered 2016-09-21: 1 mg via INTRAVENOUS

## 2016-09-21 MED ORDER — HYDROCODONE-ACETAMINOPHEN 5-325 MG PO TABS
2.0000 | ORAL_TABLET | ORAL | 0 refills | Status: DC | PRN
Start: 2016-09-21 — End: 2016-11-28

## 2016-09-21 MED ORDER — ONDANSETRON HCL 4 MG/2ML IJ SOLN
4.0000 mg | Freq: Once | INTRAMUSCULAR | Status: AC
  Administered 2016-09-21: 4 mg via INTRAVENOUS

## 2016-09-21 MED ORDER — OXYCODONE-ACETAMINOPHEN 5-325 MG PO TABS
1.0000 | ORAL_TABLET | ORAL | Status: DC | PRN
  Administered 2016-09-21: 1 via ORAL

## 2016-09-21 MED ORDER — OXYCODONE-ACETAMINOPHEN 5-325 MG PO TABS
ORAL_TABLET | ORAL | Status: AC
  Filled 2016-09-21: qty 1

## 2016-09-21 MED ORDER — METHOCARBAMOL 500 MG PO TABS: 500.0000 mg | ORAL_TABLET | Freq: Two times a day (BID) | ORAL | 0 refills | Status: DC

## 2016-09-21 MED ORDER — ONDANSETRON HCL 4 MG/2ML IJ SOLN
4.0000 mg | Freq: Once | INTRAMUSCULAR | Status: DC
  Filled 2016-09-21: qty 2

## 2016-09-21 MED ORDER — IBUPROFEN 800 MG PO TABS: 800.0000 mg | ORAL_TABLET | Freq: Three times a day (TID) | ORAL | 0 refills | Status: DC

## 2016-09-21 MED ORDER — HYDROMORPHONE HCL 1 MG/ML IJ SOLN
1.0000 mg | Freq: Once | INTRAMUSCULAR | Status: DC
  Filled 2016-09-21: qty 1

## 2016-09-21 NOTE — ED Provider Notes (Signed)
[facility]-EMER[facility]ENCY DEPT Provider Note   CSN: [id number] Arrival date & time: 09/21/16  1314     History   Chief Complaint Chief Complaint  Patient presents with  . Back Pain    HPI Douglas [place] is a 61 y.o. male.  The history is provided by the patient. No language interpreter was used.  Back Pain   This is a new problem. The current episode started 12 to 24 hours ago. The problem occurs hourly. The problem has been gradually worsening. The pain is associated with no known injury. The pain is present in the lumbar spine. The quality of the pain is described as aching. The pain does not radiate. The pain is severe. The pain is the same all the time. Pertinent negatives include no chest pain and no abdominal pain. He has tried nothing for the symptoms. The treatment provided no relief.  Pt complains of severe back pain. Pt reports he has not had in the past.   Past Medical History:  Diagnosis Date  . Allergic rhinitis   . Asthma   . Depression   . GERD (gastroesophageal reflux disease)   . Urticaria     Patient Active Problem List   Diagnosis Date Noted  . Allergic rhinitis 01/08/2015  . GERD (gastroesophageal reflux disease) 01/08/2015  . Atopic dermatitis 01/08/2015  . Allergic rhinoconjunctivitis 01/08/2015  . Allergic urticaria 01/08/2015  . Moderate persistent asthma 01/08/2015  . Atopic eczema 01/08/2015    Past Surgical History:  Procedure Laterality Date  . CHOLECYSTECTOMY         Home Medications    Prior to Admission medications   Medication Sig Start Date End Date Taking? Authorizing Provider  albuterol (VENTOLIN HFA) 108 (90 BASE) MCG/ACT inhaler Inhale 2 puffs into the lungs every 4 (four) hours as needed for wheezing or shortness of breath.    [provider]  EPINEPHrine (EPIPEN 2-PAK) 0.3 mg/0.3 mL IJ SOAJ injection Inject 0.3 mg into the muscle once.    [provider]  loratadine (CLARITIN) 10 MG tablet Take 10 mg by mouth 2  (two) times daily. Reported on 05/14/2015    [provider]  mometasone (ELOCON) 0.1 % ointment Apply 1 application topically daily as needed. Reported on 05/14/2015    [provider]  omalizumab Geoffry Paradise) 150 MG injection Inject 300 mg into the skin every 28 (twenty-eight) days. 12/30/14   [provider]  omeprazole (PRILOSEC) 20 MG capsule Take 20 mg by mouth daily.    [provider]  pravastatin (PRAVACHOL) 80 MG tablet Take 80 mg by mouth daily.    [provider]  sertraline (ZOLOFT) 50 MG tablet Reported on 05/14/2015 03/20/15   [provider]  tacrolimus (PROTOPIC) 0.1 % ointment Apply 1 application topically 2 (two) times daily as needed.    [provider]    Family History History reviewed. No pertinent family history.  Social History Social History  Substance Use Topics  . Smoking status: Former Smoker    Quit date: 04/03/1995  . Smokeless tobacco: Never Used  . Alcohol use Not on file     Allergies   Patient has no known allergies.   Review of Systems Review of Systems  Cardiovascular: Negative for chest pain.  Gastrointestinal: Negative for abdominal pain.  Musculoskeletal: Positive for back pain.  All other systems reviewed and are negative.    Physical Exam Updated Vital Signs BP (!) 149/93   Pulse 70   Temp 97.7 F (  36.5 C) (Oral)   Resp 19   SpO2 99%   Physical Exam  Constitutional: He appears well-developed and well-nourished.  HENT:  Head: Normocephalic and atraumatic.  Eyes: Conjunctivae are normal.  Neck: Neck supple.  Cardiovascular: Normal rate and regular rhythm.   No murmur heard. Pulmonary/Chest: Effort normal and breath sounds normal. No respiratory distress.  Abdominal: Soft. There is no tenderness.  Musculoskeletal: He exhibits no edema.  Tender low back   Neurological: He is alert.  Skin: Skin is warm and dry.  Psychiatric: He has a normal mood and affect.  Nursing  note and vitals reviewed.    ED Treatments / Results  Labs (all labs ordered are listed, but only abnormal results are displayed) Labs Reviewed  URINALYSIS, ROUTINE W REFLEX MICROSCOPIC    EKG  EKG Interpretation None       Radiology Ct Renal Stone Study  Result Date: 09/21/2016 CLINICAL DATA:  [age] year old with right flank pain for 1 day. Back pain. EXAM: CT ABDOMEN AND PELVIS WITHOUT CONTRAST TECHNIQUE: Multidetector CT imaging of the abdomen and pelvis was performed following the standard protocol without IV contrast. COMPARISON:  10/13/2010 FINDINGS: Lower chest: Motion artifact at the lung bases. No pleural effusions. There are stable punctate pleural-based nodules in the right lower lobe which are compatible with benign findings. There is also a stable pleural-based 5 mm nodule in the left lower lobe on sequence 7, image 9. Hepatobiliary: Normal appearance of the liver and gallbladder. Pancreas: Normal appearance of the pancreas without inflammation or duct dilatation. Spleen: Normal appearance of spleen without enlargement. Adrenals/Urinary Tract: Normal adrenal glands. Normal appearance of the urinary bladder. Normal appearance of both kidneys without hydronephrosis or stones. Stomach/Bowel: Colonic diverticula particularly involving the descending colon. There is no evidence for acute colonic inflammation. Appendix is normal. Normal appearance of the duodenum and stomach. Negative for bowel inflammation or obstruction. Vascular/Lymphatic: Wall calcifications in the abdominal aorta without aneurysm. No lymph node enlargement in the abdomen or pelvis. Reproductive: Prostate is unremarkable. There is stable asymmetry and enlargement of the left seminal vesicle. Other: Negative for free fluid. There appears to be calcifications in the right scrotum. Negative for free air. Musculoskeletal: Disc space narrowing at L5-S1. No acute bone abnormality. IMPRESSION: No acute abnormality in the  abdomen or pelvis. Negative for kidney stones or hydronephrosis. Stable small peripheral lung nodules. These are likely benign based on the stability since 2012. Diverticulosis without acute colonic inflammation. Electronically Signed   By: Richarda OverlieAdam  Henn M.D.   On: 09/21/2016 19:19    Procedures Procedures (including critical care time)  Medications Ordered in ED Medications  oxyCODONE-acetaminophen (PERCOCET/ROXICET) 5-325 MG per tablet 1 tablet (1 tablet Oral Given 09/21/16 1352)  oxyCODONE-acetaminophen (PERCOCET/ROXICET) 5-325 MG per tablet (not administered)  HYDROmorphone (DILAUDID) injection 1 mg (1 mg Intravenous Given 09/21/16 1818)  ondansetron (ZOFRAN) injection 4 mg (4 mg Intravenous Given 09/21/16 1816)     Initial Impression / Assessment and Plan / ED Course  I have reviewed the triage vital signs and the nursing notes.  Pertinent labs & imaging results that were available during my care of the patient were reviewed by me and considered in my medical decision making (see chart for details).       Final Clinical Impressions(s) / ED Diagnoses   Final diagnoses:  Right-sided low back pain without sciatica, unspecified chronicity    New Prescriptions New Prescriptions   HYDROCODONE-ACETAMINOPHEN (NORCO/VICODIN) 5-325 MG TABLET    Take 2 tablets by mouth  every 4 (four) hours as needed.   IBUPROFEN (ADVIL,MOTRIN) 800 MG TABLET    Take 1 tablet (800 mg total) by mouth 3 (three) times daily.   METHOCARBAMOL (ROBAXIN) 500 MG TABLET    Take 1 tablet (500 mg total) by mouth 2 (two) times daily.   An After Visit Summary was printed and given to the patient.   Osie Cheeks 09/21/16 Barbaraann Faster, MD 09/21/16 [phone]

## 2016-09-21 NOTE — ED Notes (Signed)
ED Provider at bedside. 

## 2016-09-21 NOTE — ED Notes (Signed)
Patient transported to CT 

## 2016-09-21 NOTE — ED Triage Notes (Signed)
Pt here with back pain into groin; [facility]

## 2016-09-21 NOTE — ED Notes (Signed)
Pt noted to have oxygen saturations at 86% with good pleth. Placed pt on 2L [place] and oxygen increased to 99%. Will cont to monitor.

## 2016-09-21 NOTE — ED Notes (Signed)
Pt returned from CT °

## 2016-09-27 DIAGNOSIS — M545 Low back pain: Secondary | ICD-10-CM | POA: Diagnosis not present

## 2016-10-04 DIAGNOSIS — M545 Low back pain: Secondary | ICD-10-CM | POA: Diagnosis not present

## 2016-10-11 DIAGNOSIS — M545 Low back pain: Secondary | ICD-10-CM | POA: Diagnosis not present

## 2016-11-28 ENCOUNTER — Encounter: Payer: Self-pay | Admitting: Allergy and Immunology

## 2016-11-28 ENCOUNTER — Ambulatory Visit (INDEPENDENT_AMBULATORY_CARE_PROVIDER_SITE_OTHER): Payer: 59 | Admitting: Allergy and Immunology

## 2016-11-28 VITALS — BP 120/84 | HR 88 | Resp 16

## 2016-11-28 DIAGNOSIS — J4540 Moderate persistent asthma, uncomplicated: Secondary | ICD-10-CM | POA: Diagnosis not present

## 2016-11-28 DIAGNOSIS — L2089 Other atopic dermatitis: Secondary | ICD-10-CM

## 2016-11-28 DIAGNOSIS — L500 Allergic urticaria: Secondary | ICD-10-CM | POA: Diagnosis not present

## 2016-11-28 DIAGNOSIS — J3089 Other allergic rhinitis: Secondary | ICD-10-CM | POA: Diagnosis not present

## 2016-11-28 NOTE — Patient Instructions (Addendum)
    1. Continue Dupilumab injections.  2. Use Ventolin HFA and Epi-Pen if needed.  3. Obtain fall flu vaccine  4. Return to clinic in 6 months

## 2016-11-28 NOTE — Progress Notes (Signed)
Follow-up Note  Referring Provider: Lonie Peakonroy, Nathan, PA-C Primary Provider: Lonie Peakonroy, Nathan, Cordelia PochePA-C Date of Office Visit: 11/28/2016  Subjective:   Douglas [facility]2016 in re-evaluation of the following:  HPI: Douglas Carroll in reevaluation of his multiorgan atopic disease including atopic dermatitis, recurrent urticaria, asthma, and allergic rhinitis. His last visit to this [facility] was February 2018 at which point in time we started him on dupilumab injections.  He has completely cleared up regarding all this respiratory allergy and his skin. He was able to go through the entire spring with no symptoms involving his respiratory tract or skin. He no longer uses any medications for his respiratory tract or skin.  Specific to his atopic dermatitis and urticaria, he has not had any outbreaks and has no itching.  Specific to his asthma, he can exercise without any difficulty, although he does have a herniated disc recently which has prevented him from exercising, and he does not use a short acting bronchodilator.  He has not required a systemic steroid or an antibiotic to treat either a respiratory tract issue or a skin issue.  Allergies as of 11/28/2016   No Known Allergies     Medication List      DUPIXENT 300 MG/2ML Sosy Generic drug:  Dupilumab Inject into the skin every 14 (fourteen) days.   EPIPEN 2-PAK 0.3 mg/0.3 mL Soaj injection Generic drug:  EPINEPHrine Inject 0.3 mg into the muscle once.   ibuprofen 800 MG tablet Commonly known as:  ADVIL,MOTRIN Take 1 tablet (800 mg total) by mouth 3 (three) times daily.   omeprazole 20 MG capsule Commonly known as:  PRILOSEC Take 20 mg by mouth daily.   pravastatin 80 MG tablet Commonly known as:  PRAVACHOL Take 80 mg by mouth daily.   VENTOLIN HFA 108 (90 Base) MCG/ACT inhaler Generic drug:  albuterol Inhale 2 puffs  into the lungs every 4 (four) hours as needed for wheezing or shortness of breath.       Past Medical History:  Diagnosis Date  . Allergic rhinitis   . Asthma   . Depression   . GERD (gastroesophageal reflux disease)   . Lumbar herniated disc   . Urticaria     Past Surgical History:  Procedure Laterality Date  . CHOLECYSTECTOMY      Review of systems negative except as noted in HPI / PMHx or noted below:  Review of Systems  Constitutional: Negative.   HENT: Negative.   Eyes: Negative.   Respiratory: Negative.   Cardiovascular: Negative.   Gastrointestinal: Negative.   Genitourinary: Negative.   Musculoskeletal: Negative.   Skin: Negative.   Neurological: Negative.   Endo/Heme/Allergies: Negative.   Psychiatric/Behavioral: Negative.      Objective:   Vitals:   11/28/16 1738  BP: 120/84  Pulse: 88  Resp: 16          Physical Exam  Constitutional: He is well-developed, well-nourished, and in no distress.  HENT:  Head: Normocephalic.  Right Ear: Tympanic membrane, external ear and ear canal normal.  Left Ear: Tympanic membrane, external ear and ear canal normal.  Nose: Nose normal. No mucosal edema or rhinorrhea.  Mouth/Throat: Uvula is midline, oropharynx is clear and moist and mucous membranes are normal. No oropharyngeal exudate.  Eyes: Conjunctivae are normal.  Neck: Trachea normal. No tracheal tenderness present. No tracheal deviation present. No thyromegaly present.  Cardiovascular: Normal rate, regular rhythm, S1 normal, S2 normal and normal heart sounds.   No murmur heard. Pulmonary/Chest: Breath sounds normal. No stridor. No respiratory distress. He has no wheezes. He has no rales.  Musculoskeletal: He exhibits no edema.  Lymphadenopathy:       Head (right side): No tonsillar adenopathy present.       Head (left side): No tonsillar adenopathy present.    He has no cervical adenopathy.  Neurological: He is alert. Gait normal.  Skin: No rash  noted. He is not diaphoretic. No erythema. Nails show no clubbing.  Psychiatric: Mood and affect normal.    Diagnostics:    Spirometry was performed and demonstrated an FEV1 of 3.50 at 101 % of predicted.  The patient had an Asthma Control Test with the following results: ACT Total Score: 25.    Assessment and Plan:   1. Other atopic dermatitis   2. Asthma, moderate persistent, well-controlled   3. Allergic urticaria   4. Other allergic rhinitis      1. Continue Dupilumab injections.  2. Use Ventolin HFA and Epi-Pen if needed.  3. Obtain fall flu vaccine  4. Return to [facility] in 6 months   Douglas Carroll on dupilumab and has basically eliminated the use of all other medications regarding his atopic disease. He will continue on this form of treatment and I will see him back in this [facility] in 6 months or earlier if there is a problem.  Laurette SchimkeEric Jamani Bearce, MD Allergy / Immunology [facility]

## 2017-01-06 DIAGNOSIS — R05 Cough: Secondary | ICD-10-CM | POA: Diagnosis not present

## 2017-01-06 DIAGNOSIS — J4520 Mild intermittent asthma, uncomplicated: Secondary | ICD-10-CM | POA: Diagnosis not present

## 2017-02-07 ENCOUNTER — Ambulatory Visit: Payer: 59 | Admitting: Allergy and Immunology

## 2017-02-08 ENCOUNTER — Telehealth: Payer: Self-pay | Admitting: Allergy and Immunology

## 2017-02-08 NOTE — Telephone Encounter (Signed)
Patient called in because he thought he was scheduled in [place] and didn't realize he was scheduled for the [facility] office.  Patient arrived in [place] at his appointment time which caused him to NO SHOW his [place] appointment.  He would like the no show fee waived.

## 2017-02-13 NOTE — Telephone Encounter (Signed)
I had only entered no show fee but had not accepted it yet. I have deleted it. I asked Joni Reining to call him and inform him.

## 2017-02-14 ENCOUNTER — Ambulatory Visit (INDEPENDENT_AMBULATORY_CARE_PROVIDER_SITE_OTHER): Payer: 59 | Admitting: Allergy and Immunology

## 2017-02-14 ENCOUNTER — Encounter: Payer: Self-pay | Admitting: Allergy and Immunology

## 2017-02-14 VITALS — BP 134/86 | HR 80 | Resp 16

## 2017-02-14 DIAGNOSIS — L2089 Other atopic dermatitis: Secondary | ICD-10-CM | POA: Diagnosis not present

## 2017-02-14 DIAGNOSIS — K219 Gastro-esophageal reflux disease without esophagitis: Secondary | ICD-10-CM | POA: Diagnosis not present

## 2017-02-14 DIAGNOSIS — J3089 Other allergic rhinitis: Secondary | ICD-10-CM

## 2017-02-14 DIAGNOSIS — J4540 Moderate persistent asthma, uncomplicated: Secondary | ICD-10-CM

## 2017-02-14 MED ORDER — MONTELUKAST SODIUM 10 MG PO TABS: 10.0000 mg | ORAL_TABLET | Freq: Every day | ORAL | 5 refills | Status: DC

## 2017-02-14 MED ORDER — OMEPRAZOLE 40 MG PO CPDR: 40.0000 mg | DELAYED_RELEASE_CAPSULE | ORAL | 5 refills | Status: DC

## 2017-02-14 MED ORDER — RANITIDINE HCL 300 MG PO TABS: 300.0000 mg | ORAL_TABLET | Freq: Every day | ORAL | 5 refills | Status: DC

## 2017-02-14 MED ORDER — BUDESONIDE-FORMOTEROL FUMARATE 160-4.5 MCG/ACT IN AERO: INHALATION_SPRAY | RESPIRATORY_TRACT | 5 refills | Status: DC

## 2017-02-14 NOTE — Patient Instructions (Addendum)
    1. Continue Dupilumab injections.  2. Treat inflammation:   A. Symbicort 160 - 2 inhalations two times per day  B. Montelukast 10 mg - one tablet one time per day  C. Prednisone  - two tablets one time per day for 10 days only  3. Treat and prevent reflux:   A. Increase omeprazole 40 mg in AM  B. Start Ranitidine 300 mg in PM  4. If needed:   A. OTC Mucinex DM 2 tablet two times per day  B. Zyrtec 10 mg one tablet one time per day  C. Ventolin HFA 2 puffs every 4-6 hours  D. Epi-pen  5. Obtain a chest x-ray  6. Return to clinic in 10-14 days

## 2017-02-14 NOTE — Progress Notes (Signed)
Follow-up Note  Referring Provider: Lonie Peak, PA-C Primary Provider: Lonie Peak, Cordelia Poche Date of Office Visit: 02/14/2017  Subjective:   Douglas Carroll (DOB: 1956/05/02) is a 61 y.o. male [facility] in re-evaluation of the following:  HPI: Douglas Carroll presents to this clinic in reevaluation of his multiorgan atopic disease including atopic dermatitis, history of recurrent urticaria, asthma, and allergic rhinitis. His last visit to this clinic was 11/28/2016 at which point in time he was doing wonderful while using dupilumab injections to control his atopic disease.  His skin has responded excellent to dupilumab and he has had no need to use any type of topical treatment. He has not had any urticaria.  He was doing great with his respiratory tract but about one month ago he developed an episode of hoarseness and then he has been coughing ever since. His hoarseness has resolved but he has been coughing to the extent where he does have occasional gagging but no vomiting but his coughing does give him a headache and it does disturb his sleep. He has minimal upper airway symptoms. Specifically, he does not have any anosmia or ugly nasal discharge or nasal congestion. He does have a little bit of ear fullness once again also precipitated to some degree by his coughing. He has had some intermittent shortness of breath and some intermittent wheezing.  He has not been having much problems with reflux. He uses his omeprazole every day and he does not have any heartburn. But he does have a fair amount of throat clearing.  Allergies as of 02/14/2017   No Known Allergies     Medication List      budesonide-formoterol 160-4.5 MCG/ACT inhaler Commonly known as:  SYMBICORT Inhale two puffs twice daily to prevent cough or wheeze.  Rinse, gargle, and spit after use.   DUPIXENT 300 MG/2ML Sosy Generic drug:  Dupilumab Inject into the skin every 14  (fourteen) days.   EPIPEN 2-PAK 0.3 mg/0.3 mL Soaj injection Generic drug:  EPINEPHrine Inject 0.3 mg into the muscle once.   ibuprofen 800 MG tablet Commonly known as:  ADVIL,MOTRIN Take 1 tablet (800 mg total) by mouth 3 (three) times daily.   montelukast 10 MG tablet Commonly known as:  SINGULAIR Take 1 tablet (10 mg total) by mouth at bedtime.   omeprazole 20 MG capsule Commonly known as:  PRILOSEC Take 20 mg by mouth daily.   omeprazole 40 MG capsule Commonly known as:  PRILOSEC Take 1 capsule (40 mg total) by mouth every morning.   pravastatin 80 MG tablet Commonly known as:  PRAVACHOL Take 80 mg by mouth daily.   ranitidine 300 MG tablet Commonly known as:  ZANTAC Take 1 tablet (300 mg total) by mouth at bedtime.   VENTOLIN HFA 108 (90 Base) MCG/ACT inhaler Generic drug:  albuterol Inhale 2 puffs into the lungs every 4 (four) hours as needed for wheezing or shortness of breath.       Past Medical History:  Diagnosis Date  . Allergic rhinitis   . Asthma   . Depression   . GERD (gastroesophageal reflux disease)   . Lumbar herniated disc   . Urticaria     Past Surgical History:  Procedure Laterality Date  . CHOLECYSTECTOMY      Review of systems negative except as noted in HPI / PMHx or noted below:  Review of Systems  Constitutional: Negative.   HENT: Negative.   Eyes:  Negative.   Respiratory: Negative.   Cardiovascular: Negative.   Gastrointestinal: Negative.   Genitourinary: Negative.   Musculoskeletal: Negative.   Skin: Negative.   Neurological: Negative.   Endo/Heme/Allergies: Negative.   Psychiatric/Behavioral: Negative.      Objective:   Vitals:   02/14/17 1646  BP: 134/86  Pulse: 80  Resp: 16  SpO2: 92%          Physical Exam  Constitutional: He is well-developed, well-nourished, and in no distress.  HENT:  Head: Normocephalic.  Right Ear: Tympanic membrane, external ear and ear canal normal.  Left Ear: Tympanic  membrane, external ear and ear canal normal.  Nose: Nose normal. No mucosal edema or rhinorrhea.  Mouth/Throat: Uvula is midline, oropharynx is clear and moist and mucous membranes are normal. No oropharyngeal exudate.  Eyes: Conjunctivae are normal.  Neck: Trachea normal. No tracheal tenderness present. No tracheal deviation present. No thyromegaly present.  Cardiovascular: Normal rate, regular rhythm, S1 normal, S2 normal and normal heart sounds.   No murmur heard. Pulmonary/Chest: No stridor. No respiratory distress. Wheezes: inspiratory crackles posterior bases bilaterally. He has no rales.  Musculoskeletal: He exhibits no edema.  Lymphadenopathy:       Head (right side): No tonsillar adenopathy present.       Head (left side): No tonsillar adenopathy present.    He has no cervical adenopathy.  Neurological: He is alert. Gait normal.  Skin: No rash noted. He is not diaphoretic. No erythema. Nails show no clubbing.  Psychiatric: Mood and affect normal.    Diagnostics:    Spirometry was performed and demonstrated an FEV1 of 3.03 at 75 % of predicted. Following the administration of nebulized albuterol his FEV1 did not improve.  The patient had an Asthma Control Test with the following results: ACT Total Score: 20.    Assessment and Plan:   1. Not well controlled moderate persistent asthma   2. Other allergic rhinitis   3. Other atopic dermatitis   4. LPRD (laryngopharyngeal reflux disease)      1. Continue Dupilumab injections.  2. Treat inflammation:   A. Symbicort 160 - 2 inhalations two times per day  B. Montelukast 10 mg - one tablet one time per day  C. Prednisone  - two tablets one time per day for 10 days only  3. Treat and prevent reflux:   A. Increase omeprazole 40 mg in AM  B. Start Ranitidine 300 mg in PM  4. If needed:   A. OTC Mucinex DM 2 tablet two times per day  B. Zyrtec 10 mg one tablet one time per day  C. Ventolin HFA 2 puffs every 4-6  hours  D. Epi-pen  5. Obtain a chest x-ray  6. Return to clinic in 10-14 days    I will assume that Douglas Carroll has inflammation and irritation of his respiratory tract and treat him with anti-inflammatory agents as noted above as well as increasing his therapy for reflux. However, his exam today suggested that there may be a different process giving rise to his cough with the presence of inspiratory crackles and I'm going to obtain a chest x-ray in investigation of this issue. I will regroup with him in 10-14 days to assess his response to therapy and consider further evaluation treatment based upon his response and the results of his chest x-ray.  Laurette Schimke, MD Allergy / Immunology [facility]

## 2017-02-22 DIAGNOSIS — R972 Elevated prostate specific antigen [PSA]: Secondary | ICD-10-CM | POA: Diagnosis not present

## 2017-02-28 ENCOUNTER — Encounter: Payer: Self-pay | Admitting: Allergy and Immunology

## 2017-02-28 ENCOUNTER — Ambulatory Visit (INDEPENDENT_AMBULATORY_CARE_PROVIDER_SITE_OTHER): Payer: 59 | Admitting: Allergy and Immunology

## 2017-02-28 VITALS — BP 128/80 | HR 78 | Resp 17

## 2017-02-28 DIAGNOSIS — J3089 Other allergic rhinitis: Secondary | ICD-10-CM

## 2017-02-28 DIAGNOSIS — J4540 Moderate persistent asthma, uncomplicated: Secondary | ICD-10-CM

## 2017-02-28 DIAGNOSIS — L2089 Other atopic dermatitis: Secondary | ICD-10-CM

## 2017-02-28 DIAGNOSIS — K219 Gastro-esophageal reflux disease without esophagitis: Secondary | ICD-10-CM

## 2017-02-28 NOTE — Patient Instructions (Addendum)
    1. Continue Dupilumab injections.  2. Continue to Treat inflammation:   A. Symbicort 160 - 2 inhalations two times per day  B. Montelukast 10 mg - one tablet one time per day  3. Continue to Treat and prevent reflux:   A. omeprazole 40 mg in AM  B. Ranitidine 300 mg in PM  4. If needed:   A. OTC Mucinex DM 2 tablet two times per day  B. Zyrtec 10 mg one tablet one time per day  C. Ventolin HFA 2 puffs every 4-6 hours  D. Epi-pen  5. Return to clinic Christmas 2018 or earlier if problem - taper?

## 2017-02-28 NOTE — Progress Notes (Signed)
Follow-up Note  Referring Provider: Lonie Peakonroy, Nathan, PA-C Primary Provider: Lonie Peakonroy, Nathan, Cordelia PochePA-C Date of Office Visit: 02/28/2017  Subjective:   Douglas Carroll (DOB: May 06, 1955) is a 61 y.o. male who [facility]2017 in re-evaluation of the following:  HPI: Douglas HeckRandy returns to this clinic in reevaluation of his multiorgan atopic disease including atopic dermatitis, recurrent urticaria, asthma, allergic rhinitis. His last visit to this clinic was 02/14/2017 at which point in time he was having a significant problem with what appeared to be respiratory tract inflammation and LPR.  He has significantly improved since he was last seen in this clinic. He has had a dramatic decrease in his cough but still has some slight hoarseness and some slight mucus stuck in his throat. Overall though he is very pleased with the response that he has received with his current therapy. He does not need to use a short acting bronchodilator at this point.  He has not been having any problems with his skin.  He did not obtain the chest x-ray requested during his last visit.  Allergies as of 02/28/2017   No Known Allergies     Medication List      budesonide-formoterol 160-4.5 MCG/ACT inhaler Commonly known as:  SYMBICORT Inhale two puffs twice daily to prevent cough or wheeze.  Rinse, gargle, and spit after use.   DUPIXENT 300 MG/2ML Sosy Generic drug:  Dupilumab Inject into the skin every 14 (fourteen) days.   EPIPEN 2-PAK 0.3 mg/0.3 mL Soaj injection Generic drug:  EPINEPHrine Inject 0.3 mg into the muscle once.   ibuprofen 800 MG tablet Commonly known as:  ADVIL,MOTRIN Take 1 tablet (800 mg total) by mouth 3 (three) times daily.   montelukast 10 MG tablet Commonly known as:  SINGULAIR Take 1 tablet (10 mg total) by mouth at bedtime.   omeprazole 20 MG capsule Commonly known as:  PRILOSEC Take 20 mg by mouth daily.   omeprazole 40 MG  capsule Commonly known as:  PRILOSEC Take 1 capsule (40 mg total) by mouth every morning.   pravastatin 80 MG tablet Commonly known as:  PRAVACHOL Take 80 mg by mouth daily.   ranitidine 300 MG tablet Commonly known as:  ZANTAC Take 1 tablet (300 mg total) by mouth at bedtime.   sertraline 50 MG tablet Commonly known as:  ZOLOFT   VENTOLIN HFA 108 (90 Base) MCG/ACT inhaler Generic drug:  albuterol Inhale 2 puffs into the lungs every 4 (four) hours as needed for wheezing or shortness of breath.       Past Medical History:  Diagnosis Date  . Allergic rhinitis   . Asthma   . Depression   . GERD (gastroesophageal reflux disease)   . Lumbar herniated disc   . Urticaria     Past Surgical History:  Procedure Laterality Date  . CHOLECYSTECTOMY      Review of systems negative except as noted in HPI / PMHx or noted below:  Review of Systems  Constitutional: Negative.   HENT: Negative.   Eyes: Negative.   Respiratory: Negative.   Cardiovascular: Negative.   Gastrointestinal: Negative.   Genitourinary: Negative.   Musculoskeletal: Negative.   Skin: Negative.   Neurological: Negative.   Endo/Heme/Allergies: Negative.   Psychiatric/Behavioral: Negative.      Objective:   Vitals:   02/28/17 1730  BP: 128/80  Pulse: 78  Resp: 17  SpO2: 93%          Physical Exam  Constitutional: He is well-developed, well-nourished, and in no distress.  HENT:  Head: Normocephalic.  Right Ear: Tympanic membrane, external ear and ear canal normal.  Left Ear: Tympanic membrane, external ear and ear canal normal.  Nose: Nose normal. No mucosal edema or rhinorrhea.  Mouth/Throat: Uvula is midline, oropharynx is clear and moist and mucous membranes are normal. No oropharyngeal exudate.  Eyes: Conjunctivae are normal.  Neck: Trachea normal. No tracheal tenderness present. No tracheal deviation present. No thyromegaly present.  Cardiovascular: Normal rate, regular rhythm, S1  normal, S2 normal and normal heart sounds.   No murmur heard. Pulmonary/Chest: Breath sounds normal. No stridor. No respiratory distress. He has no wheezes. He has no rales.  Musculoskeletal: He exhibits no edema.  Lymphadenopathy:       Head (right side): No tonsillar adenopathy present.       Head (left side): No tonsillar adenopathy present.    He has no cervical adenopathy.  Neurological: He is alert. Gait normal.  Skin: No rash noted. He is not diaphoretic. No erythema. Nails show no clubbing.  Psychiatric: Mood and affect normal.    Diagnostics:    Spirometry was performed and demonstrated an FEV1 of 3.43 at 100 % of predicted.  The patient had an Asthma Control Test with the following results: ACT Total Score: 22.    Assessment and Plan:   1. Asthma, moderate persistent, well-controlled   2. Other allergic rhinitis   3. Other atopic dermatitis   4. LPRD (laryngopharyngeal reflux disease)      1. Continue Dupilumab injections.  2. Continue to Treat inflammation:   A. Symbicort 160 - 2 inhalations two times per day  B. Montelukast 10 mg - one tablet one time per day  3. Continue to Treat and prevent reflux:   A. omeprazole 40 mg in AM  B. Ranitidine 300 mg in PM  4. If needed:   A. OTC Mucinex DM 2 tablet two times per day  B. Zyrtec 10 mg one tablet one time per day  C. Ventolin HFA 2 puffs every 4-6 hours  D. Epi-pen  5. Return to clinic Christmas 2018 or earlier if problem - taper?  Douglas Carroll is much better and I will assume he will continue to improve with each passing week the further he gets away from his recent respiratory tract insult and inflammatory flare. If he does well when I see him in this clinic for next visit we will attempt to taper some of his medicines. If for some reason he has difficulty in the face of this plan he will contact me for further evaluation and treatment.  Laurette Schimke, MD Allergy / Immunology [facility]

## 2017-03-01 ENCOUNTER — Other Ambulatory Visit (HOSPITAL_COMMUNITY): Payer: Self-pay | Admitting: Urology

## 2017-03-01 DIAGNOSIS — R972 Elevated prostate specific antigen [PSA]: Secondary | ICD-10-CM | POA: Diagnosis not present

## 2017-03-01 DIAGNOSIS — R3912 Poor urinary stream: Secondary | ICD-10-CM | POA: Diagnosis not present

## 2017-03-01 DIAGNOSIS — N401 Enlarged prostate with lower urinary tract symptoms: Secondary | ICD-10-CM | POA: Diagnosis not present

## 2017-03-01 NOTE — Addendum Note (Signed)
Addended by: Dub MikesHICKS, Aundrea Horace N on: 03/01/2017 09:12 AM   Modules accepted: Orders

## 2017-03-07 DIAGNOSIS — Z23 Encounter for immunization: Secondary | ICD-10-CM | POA: Diagnosis not present

## 2017-03-07 DIAGNOSIS — K219 Gastro-esophageal reflux disease without esophagitis: Secondary | ICD-10-CM | POA: Diagnosis not present

## 2017-03-07 DIAGNOSIS — E7800 Pure hypercholesterolemia, unspecified: Secondary | ICD-10-CM | POA: Diagnosis not present

## 2017-03-07 DIAGNOSIS — J4520 Mild intermittent asthma, uncomplicated: Secondary | ICD-10-CM | POA: Diagnosis not present

## 2017-03-27 ENCOUNTER — Ambulatory Visit (HOSPITAL_COMMUNITY)
Admission: RE | Admit: 2017-03-27 | Discharge: 2017-03-27 | Disposition: A | Discharge status: Home / Self Care | Payer: 59 | Source: Ambulatory Visit | Attending: Urology | Admitting: Urology

## 2017-03-27 DIAGNOSIS — R972 Elevated prostate specific antigen [PSA]: Secondary | ICD-10-CM | POA: Diagnosis not present

## 2017-03-27 DIAGNOSIS — N400 Enlarged prostate without lower urinary tract symptoms: Secondary | ICD-10-CM | POA: Insufficient documentation

## 2017-03-27 DIAGNOSIS — N401 Enlarged prostate with lower urinary tract symptoms: Secondary | ICD-10-CM | POA: Diagnosis not present

## 2017-03-27 LAB — POCT I-STAT CREATININE: Creatinine, Ser: 0.9 mg/dL (ref 0.61–1.24)

## 2017-03-27 MED ORDER — LIDOCAINE HCL 2 % EX GEL: 1.0000 "application " | Freq: Once | CUTANEOUS | Status: DC

## 2017-03-27 MED ORDER — LIDOCAINE HCL 2 % EX GEL
CUTANEOUS | Status: AC
  Filled 2017-03-27: qty 30

## 2017-03-27 MED ORDER — GADOBENATE DIMEGLUMINE 529 MG/ML IV SOLN
20.0000 mL | Freq: Once | INTRAVENOUS | Status: AC | PRN
  Administered 2017-03-27: 17 mL via INTRAVENOUS

## 2017-04-11 ENCOUNTER — Ambulatory Visit (INDEPENDENT_AMBULATORY_CARE_PROVIDER_SITE_OTHER): Payer: 59 | Admitting: Allergy and Immunology

## 2017-04-11 ENCOUNTER — Encounter: Payer: Self-pay | Admitting: Allergy and Immunology

## 2017-04-11 VITALS — BP 108/66 | HR 76 | Resp 16

## 2017-04-11 DIAGNOSIS — J4540 Moderate persistent asthma, uncomplicated: Secondary | ICD-10-CM | POA: Diagnosis not present

## 2017-04-11 DIAGNOSIS — L500 Allergic urticaria: Secondary | ICD-10-CM | POA: Diagnosis not present

## 2017-04-11 DIAGNOSIS — L2089 Other atopic dermatitis: Secondary | ICD-10-CM | POA: Diagnosis not present

## 2017-04-11 DIAGNOSIS — K219 Gastro-esophageal reflux disease without esophagitis: Secondary | ICD-10-CM

## 2017-04-11 DIAGNOSIS — J3089 Other allergic rhinitis: Secondary | ICD-10-CM | POA: Diagnosis not present

## 2017-04-11 NOTE — Patient Instructions (Signed)
    1. Continue Dupilumab injections. Pick up sample Friday  2. Continue to Treat inflammation:   A. Symbicort 160 - 2 inhalations 1-2 times per day  B. Montelukast 10 mg - one tablet one time per day  3. Continue to Treat and prevent reflux:   A. omeprazole 40 mg in AM  B. Ranitidine 300 mg in PM if needed  4. If needed:   A. OTC Mucinex DM 2 tablet two times per day  B. Zyrtec 10 mg one tablet one time per day  C. Ventolin HFA 2 puffs every 4-6 hours  D. Epi-pen  5. Return to clinic 4 months or earlier if problem

## 2017-04-11 NOTE — Progress Notes (Signed)
Follow-up Note  Referring Provider: Lonie Peakonroy, Nathan, PA-C Primary Provider: Lonie Peakonroy, Nathan, Cordelia PochePA-C Date of Office Visit: 04/11/2017  Subjective:   Douglas Carroll (DOB: [birth year]) is a 61 y.o. male who [facility] 04[facility]/2017 in re-evaluation of the following:  HPI: Douglas Carroll returns to this clinic in reevaluation of his asthma and allergic rhinoconjunctivitis and atopic dermatitis and history of recurrent urticaria.  His last visit to this clinic was 28 February 2017.  He has continued to do very well on his current plan, which includes dupilumab, regarding both his airway and his skin.  He is having some problems paying for dupilumab.  The co-pay card from the dupilumab manufacturer has expired this month and will not be reinstated until February.  He utilizes 300 mg every 2 weeks of dupilumab.  He also continues on inhaled steroids and a leukotriene modifier and rarely uses a short acting bronchodilator and can exercise without any difficulty at all.  Likewise, he is doing very well with his reflux and has had no issues with his throat while utilizing a combination of proton pump inhibitor and a H2 receptor blocker.  Allergies as of 04/11/2017   No Known Allergies     Medication List      budesonide-formoterol 160-4.5 MCG/ACT inhaler Commonly known as:  SYMBICORT Inhale two puffs twice daily to prevent cough or wheeze.  Rinse, gargle, and spit after use.   DUPIXENT 300 MG/2ML Sosy Generic drug:  Dupilumab Inject into the skin every 14 (fourteen) days.   EPIPEN 2-PAK 0.3 mg/0.3 mL Soaj injection Generic drug:  EPINEPHrine Inject 0.3 mg into the muscle once.   ibuprofen 800 MG tablet Commonly known as:  ADVIL,MOTRIN Take 1 tablet (800 mg total) by mouth 3 (three) times daily.   montelukast 10 MG tablet Commonly known as:  SINGULAIR Take 1 tablet (10 mg total) by mouth at bedtime.   omeprazole 40 MG capsule Commonly known as:  PRILOSEC Take  1 capsule (40 mg total) by mouth every morning.   pravastatin 80 MG tablet Commonly known as:  PRAVACHOL Take 80 mg by mouth daily.   ranitidine 300 MG tablet Commonly known as:  ZANTAC Take 1 tablet (300 mg total) by mouth at bedtime.   sertraline 50 MG tablet Commonly known as:  ZOLOFT   VENTOLIN HFA 108 (90 Base) MCG/ACT inhaler Generic drug:  albuterol Inhale 2 puffs into the lungs every 4 (four) hours as needed for wheezing or shortness of breath.       Past Medical History:  Diagnosis Date  . Allergic rhinitis   . Asthma   . Depression   . GERD (gastroesophageal reflux disease)   . Lumbar herniated disc   . Urticaria     Past Surgical History:  Procedure Laterality Date  . CHOLECYSTECTOMY      Review of systems negative except as noted in HPI / PMHx or noted below:  Review of Systems  Constitutional: Negative.   HENT: Negative.   Eyes: Negative.   Respiratory: Negative.   Cardiovascular: Negative.   Gastrointestinal: Negative.   Genitourinary: Negative.   Musculoskeletal: Negative.   Skin: Negative.   Neurological: Negative.   Endo/Heme/Allergies: Negative.   Psychiatric/Behavioral: Negative.      Objective:   Vitals:   04/11/17 1408  BP: 108/66  Pulse: 76  Resp: 16          Physical Exam  Constitutional: He is well-developed, well-nourished, and in no distress.  HENT:  Head: Normocephalic.  Right Ear: Tympanic membrane, external ear and ear canal normal.  Left Ear: Tympanic membrane, external ear and ear canal normal.  Nose: Nose normal. No mucosal edema or rhinorrhea.  Mouth/Throat: Uvula is midline, oropharynx is clear and moist and mucous membranes are normal. No oropharyngeal exudate.  Eyes: Conjunctivae are normal.  Neck: Trachea normal. No tracheal tenderness present. No tracheal deviation present. No thyromegaly present.  Cardiovascular: Normal rate, regular rhythm, S1 normal, S2 normal and normal heart sounds.  No murmur  heard. Pulmonary/Chest: Breath sounds normal. No stridor. No respiratory distress. He has no wheezes. He has no rales.  Musculoskeletal: He exhibits no edema.  Lymphadenopathy:       Head (right side): No tonsillar adenopathy present.       Head (left side): No tonsillar adenopathy present.    He has no cervical adenopathy.  Neurological: He is alert. Gait normal.  Skin: No rash noted. He is not diaphoretic. No erythema. Nails show no clubbing.  Psychiatric: Mood and affect normal.    Diagnostics:    Spirometry was performed and demonstrated an FEV1 of 3.40 at 85 % of predicted.  Assessment and Plan:   1. Asthma, moderate persistent, well-controlled   2. Other allergic rhinitis   3. Other atopic dermatitis   4. Allergic urticaria   5. LPRD (laryngopharyngeal reflux disease)      1. Continue Dupilumab injections. Pick up sample Friday  2. Continue to Treat inflammation:   A. Symbicort 160 - 2 inhalations 1-2 times per day  B. Montelukast 10 mg - one tablet one time per day  3. Continue to Treat and prevent reflux:   A. omeprazole 40 mg in AM  B. Ranitidine 300 mg in PM if needed  4. If needed:   A. OTC Mucinex DM 2 tablet two times per day  B. Zyrtec 10 mg one tablet one time per day  C. Ventolin HFA 2 puffs every 4-6 hours  D. Epi-pen  5. Return to clinic 4 months or earlier if problem    Douglas Carroll is doing wonderful and we will now see if we can consolidate some of his therapy.  I have informed him that he has the option to decrease his Symbicort to 1 time per day and to discontinue his ranitidine.  Assuming he does well I will see him back in his clinic in 4 months or earlier if there is a problem.  Laurette SchimkeEric Kozlow, MD Allergy / Immunology [facility]

## 2017-04-12 ENCOUNTER — Encounter: Payer: Self-pay | Admitting: Allergy and Immunology

## 2017-05-11 DIAGNOSIS — R200 Anesthesia of skin: Secondary | ICD-10-CM | POA: Diagnosis not present

## 2017-05-25 DIAGNOSIS — M7541 Impingement syndrome of right shoulder: Secondary | ICD-10-CM | POA: Diagnosis not present

## 2017-05-29 ENCOUNTER — Ambulatory Visit: Payer: 59 | Admitting: Allergy and Immunology

## 2017-06-01 DIAGNOSIS — M75101 Unspecified rotator cuff tear or rupture of right shoulder, not specified as traumatic: Secondary | ICD-10-CM | POA: Diagnosis not present

## 2017-06-12 ENCOUNTER — Other Ambulatory Visit: Payer: Self-pay | Admitting: Allergy and Immunology

## 2017-07-06 DIAGNOSIS — M75101 Unspecified rotator cuff tear or rupture of right shoulder, not specified as traumatic: Secondary | ICD-10-CM | POA: Diagnosis not present

## 2017-07-11 ENCOUNTER — Encounter: Payer: Self-pay | Admitting: Allergy and Immunology

## 2017-07-11 ENCOUNTER — Ambulatory Visit (INDEPENDENT_AMBULATORY_CARE_PROVIDER_SITE_OTHER): Payer: 59 | Admitting: Allergy and Immunology

## 2017-07-11 VITALS — BP 120/70 | HR 76 | Resp 12

## 2017-07-11 DIAGNOSIS — L500 Allergic urticaria: Secondary | ICD-10-CM

## 2017-07-11 DIAGNOSIS — K219 Gastro-esophageal reflux disease without esophagitis: Secondary | ICD-10-CM

## 2017-07-11 DIAGNOSIS — J4540 Moderate persistent asthma, uncomplicated: Secondary | ICD-10-CM | POA: Diagnosis not present

## 2017-07-11 DIAGNOSIS — J3089 Other allergic rhinitis: Secondary | ICD-10-CM

## 2017-07-11 DIAGNOSIS — L2089 Other atopic dermatitis: Secondary | ICD-10-CM | POA: Diagnosis not present

## 2017-07-11 NOTE — Patient Instructions (Addendum)
    1. Continue Dupilumab injections.    2. May need to Treat inflammation during spring:   A. Symbicort 160 - 2 inhalations 1-2 times per day  B. Montelukast 10 mg - one tablet one time per day  3. Continue to Treat and prevent reflux:   A. omeprazole 40 mg in AM  B. Ranitidine 300 mg in PM if needed  4. If needed:   A. OTC Mucinex DM 2 tablet two times per day  B. Zyrtec 10 mg one tablet one time per day  C. Ventolin HFA 2 puffs every 4-6 hours  D. Epi-pen  E. PATANOL - one drop each eye two times per day  5. Return to clinic 4 months or earlier if problem

## 2017-07-11 NOTE — Progress Notes (Signed)
Follow-up Note  Referring Provider: Lonie Peak, PA-C Primary Provider: Lonie Peak, Cordelia Poche Date of Office Visit: 07/11/2017  Subjective:   Douglas Carroll (DOB: 1956/01/24) is a 62 y.o. male who [facility] in re-evaluation of the following:  HPI: Douglas Carroll returns to this clinic in reevaluation of his asthma, allergic rhinoconjunctivitis, history of atopic dermatitis, and history of urticaria.  His last visit to this clinic was 11 April 2017.  Dupilumab has changed his life dramatically.  He has had very little issues with his nose or chest or skin while utilizing this form of treatment and in fact has been able to taper off almost all of his medications while using these injections.  He no longer uses any topical steroid or Symbicort or montelukast or antihistamines.  However, he has been having problem with his eyes.  His eye disease has never really resolved with dupilumab.  It has not become any worse on dupilumab.  He now has a little bit more problem with his eye because he has had a dog introduced into the house as his daughter now lives inside the household with her dog.  His reflux is under excellent control with omeprazole and he does not need to use ranitidine at this point.  As well, he has developed a right rotator cuff tear and is scheduled for arthroscopy sometime in the next month.  He did obtain a flu vaccine.  Allergies as of 07/11/2017   No Known Allergies     Medication List      budesonide-formoterol 160-4.5 MCG/ACT inhaler Commonly known as:  SYMBICORT Inhale two puffs twice daily to prevent cough or wheeze.  Rinse, gargle, and spit after use.   DUPIXENT 300 MG/2ML Sosy Generic drug:  Dupilumab INJECT CONTENTS OF 1 SYRINGE(300MG ) SUBCUTANEOUSLY EVERY 14 DAYS   EPIPEN 2-PAK 0.3 mg/0.3 mL Soaj injection Generic drug:  EPINEPHrine Inject 0.3 mg into the muscle once.   ibuprofen 800 MG tablet Commonly  known as:  ADVIL,MOTRIN Take 1 tablet (800 mg total) by mouth 3 (three) times daily.   montelukast 10 MG tablet Commonly known as:  SINGULAIR Take 1 tablet (10 mg total) by mouth at bedtime.   omeprazole 40 MG capsule Commonly known as:  PRILOSEC Take 1 capsule (40 mg total) by mouth every morning.   pravastatin 80 MG tablet Commonly known as:  PRAVACHOL Take 80 mg by mouth daily.   ranitidine 300 MG tablet Commonly known as:  ZANTAC Take 1 tablet (300 mg total) by mouth at bedtime.   sertraline 50 MG tablet Commonly known as:  ZOLOFT   VENTOLIN HFA 108 (90 Base) MCG/ACT inhaler Generic drug:  albuterol Inhale 2 puffs into the lungs every 4 (four) hours as needed for wheezing or shortness of breath.       Past Medical History:  Diagnosis Date  . Allergic rhinitis   . Asthma   . Depression   . GERD (gastroesophageal reflux disease)   . Lumbar herniated disc   . Urticaria     Past Surgical History:  Procedure Laterality Date  . CHOLECYSTECTOMY      Review of systems negative except as noted in HPI / PMHx or noted below:  Review of Systems  Constitutional: Negative.   HENT: Negative.   Eyes: Negative.   Respiratory: Negative.   Cardiovascular: Negative.   Gastrointestinal: Negative.   Genitourinary: Negative.   Musculoskeletal: Negative.   Skin: Negative.   Neurological:  Negative.   Endo/Heme/Allergies: Negative.   Psychiatric/Behavioral: Negative.      Objective:   Vitals:   07/11/17 1633  BP: 120/70  Pulse: 76  Resp: 12          Physical Exam  Constitutional: He is well-developed, well-nourished, and in no distress.  HENT:  Head: Normocephalic.  Right Ear: Tympanic membrane, external ear and ear canal normal.  Left Ear: Tympanic membrane, external ear and ear canal normal.  Nose: Nose normal. No mucosal edema or rhinorrhea.  Mouth/Throat: Uvula is midline, oropharynx is clear and moist and mucous membranes are normal. No oropharyngeal  exudate.  Eyes: Right conjunctiva is injected. Left conjunctiva is injected.  Neck: Trachea normal. No tracheal tenderness present. No tracheal deviation present. No thyromegaly present.  Cardiovascular: Normal rate, regular rhythm, S1 normal, S2 normal and normal heart sounds.  No murmur heard. Pulmonary/Chest: Breath sounds normal. No stridor. No respiratory distress. He has no wheezes. He has no rales.  Musculoskeletal: He exhibits no edema.  Lymphadenopathy:       Head (right side): No tonsillar adenopathy present.       Head (left side): No tonsillar adenopathy present.    He has no cervical adenopathy.  Neurological: He is alert. Gait normal.  Skin: No rash noted. He is not diaphoretic. No erythema. Nails show no clubbing.  Psychiatric: Mood and affect normal.    Diagnostics:    Spirometry was performed and demonstrated an FEV1 of 2.14 at 60 % of predicted.  Assessment and Plan:   1. Asthma, moderate persistent, well-controlled   2. Other allergic rhinitis   3. Other atopic dermatitis   4. Allergic urticaria   5. LPRD (laryngopharyngeal reflux disease)      1. Continue Dupilumab injections.    2. May need to Treat inflammation during spring:   A. Symbicort 160 - 2 inhalations 1-2 times per day  B. Montelukast 10 mg - one tablet one time per day  3. Continue to Treat and prevent reflux:   A. omeprazole 40 mg in AM  B. Ranitidine 300 mg in PM if needed  4. If needed:   A. OTC Mucinex DM 2 tablet two times per day  B. Zyrtec 10 mg one tablet one time per day  C. Ventolin HFA 2 puffs every 4-6 hours  D. Epi-pen  E. PATANOL - one drop each eye two times per day  5. Return to clinic 4 months or earlier if problem    Douglas HeckRandy will continue to utilize dupilumab to control his atopic disease.  He may need to restart his other anti-inflammatory medications for his respiratory tract as he goes through this upcoming spring time season.  As well, I have given him Patanol for  his eyes and of course he should attempt to perform avoidance measures directed against the dog located inside the house.  I have refilled his medications for reflux as well.  I will see him back in this clinic in 4 months or earlier if there is a problem.  Laurette SchimkeEric Kelven Flater, MD Allergy / Immunology  [facility]

## 2017-07-12 ENCOUNTER — Encounter: Payer: Self-pay | Admitting: Allergy and Immunology

## 2017-07-24 ENCOUNTER — Other Ambulatory Visit: Payer: Self-pay | Admitting: *Deleted

## 2017-07-24 MED ORDER — OLOPATADINE HCL 0.1 % OP SOLN: 1.0000 [drp] | Freq: Two times a day (BID) | OPHTHALMIC | 5 refills | Status: DC

## 2017-07-31 DIAGNOSIS — M19011 Primary osteoarthritis, right shoulder: Secondary | ICD-10-CM | POA: Diagnosis not present

## 2017-07-31 DIAGNOSIS — M24111 Other articular cartilage disorders, right shoulder: Secondary | ICD-10-CM | POA: Diagnosis not present

## 2017-07-31 DIAGNOSIS — G8918 Other acute postprocedural pain: Secondary | ICD-10-CM | POA: Diagnosis not present

## 2017-07-31 DIAGNOSIS — M75121 Complete rotator cuff tear or rupture of right shoulder, not specified as traumatic: Secondary | ICD-10-CM | POA: Diagnosis not present

## 2017-07-31 DIAGNOSIS — M7541 Impingement syndrome of right shoulder: Secondary | ICD-10-CM | POA: Diagnosis not present

## 2017-07-31 HISTORY — PX: SHOULDER OPEN ROTATOR CUFF REPAIR: SHX2407

## 2017-08-21 ENCOUNTER — Other Ambulatory Visit: Payer: Self-pay | Admitting: Allergy and Immunology

## 2017-08-28 DIAGNOSIS — M25511 Pain in right shoulder: Secondary | ICD-10-CM | POA: Diagnosis not present

## 2017-08-28 DIAGNOSIS — R972 Elevated prostate specific antigen [PSA]: Secondary | ICD-10-CM | POA: Diagnosis not present

## 2017-09-06 DIAGNOSIS — R748 Abnormal levels of other serum enzymes: Secondary | ICD-10-CM | POA: Diagnosis not present

## 2017-09-06 DIAGNOSIS — Z0000 Encounter for general adult medical examination without abnormal findings: Secondary | ICD-10-CM | POA: Diagnosis not present

## 2017-09-06 DIAGNOSIS — M2550 Pain in unspecified joint: Secondary | ICD-10-CM | POA: Diagnosis not present

## 2017-09-07 DIAGNOSIS — M25511 Pain in right shoulder: Secondary | ICD-10-CM | POA: Diagnosis not present

## 2017-09-07 DIAGNOSIS — N400 Enlarged prostate without lower urinary tract symptoms: Secondary | ICD-10-CM | POA: Diagnosis not present

## 2017-09-07 DIAGNOSIS — R972 Elevated prostate specific antigen [PSA]: Secondary | ICD-10-CM | POA: Diagnosis not present

## 2017-09-14 DIAGNOSIS — M25511 Pain in right shoulder: Secondary | ICD-10-CM | POA: Diagnosis not present

## 2017-09-19 DIAGNOSIS — M25511 Pain in right shoulder: Secondary | ICD-10-CM | POA: Diagnosis not present

## 2017-09-27 DIAGNOSIS — M25511 Pain in right shoulder: Secondary | ICD-10-CM | POA: Diagnosis not present

## 2017-10-04 DIAGNOSIS — M25511 Pain in right shoulder: Secondary | ICD-10-CM | POA: Diagnosis not present

## 2017-10-12 DIAGNOSIS — M25511 Pain in right shoulder: Secondary | ICD-10-CM | POA: Diagnosis not present

## 2017-10-16 DIAGNOSIS — M25511 Pain in right shoulder: Secondary | ICD-10-CM | POA: Diagnosis not present

## 2017-11-09 DIAGNOSIS — M25571 Pain in right ankle and joints of right foot: Secondary | ICD-10-CM | POA: Diagnosis not present

## 2017-11-14 ENCOUNTER — Encounter: Payer: Self-pay | Admitting: Allergy and Immunology

## 2017-11-14 ENCOUNTER — Ambulatory Visit (INDEPENDENT_AMBULATORY_CARE_PROVIDER_SITE_OTHER): Payer: 59 | Admitting: Allergy and Immunology

## 2017-11-14 VITALS — BP 122/80 | HR 97 | Temp 98.2°F | Resp 20 | Ht 68.5 in | Wt 182.0 lb

## 2017-11-14 DIAGNOSIS — L2089 Other atopic dermatitis: Secondary | ICD-10-CM | POA: Diagnosis not present

## 2017-11-14 DIAGNOSIS — J3089 Other allergic rhinitis: Secondary | ICD-10-CM | POA: Diagnosis not present

## 2017-11-14 DIAGNOSIS — L500 Allergic urticaria: Secondary | ICD-10-CM | POA: Diagnosis not present

## 2017-11-14 DIAGNOSIS — K219 Gastro-esophageal reflux disease without esophagitis: Secondary | ICD-10-CM

## 2017-11-14 DIAGNOSIS — J4540 Moderate persistent asthma, uncomplicated: Secondary | ICD-10-CM

## 2017-11-14 NOTE — Patient Instructions (Addendum)
    1. Continue Dupilumab injections.    2. Can restart the following if asthma flare:   A. Symbicort 160 - 2 inhalations 1-2 times per day  B. Montelukast 10 mg - one tablet one time per day  C. Ventolin HFA 2 inhalations every 4-6 hours if needed  3. Continue to Treat and prevent reflux:   A. omeprazole 40 mg in AM  B. Ranitidine 300 mg in PM if needed  4. If needed:   A. OTC Mucinex DM 2 tablet two times per day  B. Zyrtec 10 mg one tablet one time per day  C. Epi-pen  D. PATANOL - one drop each eye two times per day  5. Return to clinic 6 months or earlier if problem    6. Obtain fall flu vaccine

## 2017-11-14 NOTE — Progress Notes (Signed)
Follow-up Note  Referring Provider: Lonie Peak, PA-C Primary Provider: Lonie Peak, PA-C Date of Office Visit: 11/14/2017  Subjective:   Douglas Carroll (DOB: 1955/07/08) is a 62 y.o. male who [facility] in re-evaluation of the following:  HPI: Douglas Carroll returns to this clinic in reevaluation of his asthma and allergic rhinoconjunctivitis and atopic dermatitis and urticaria treated with dupilumab.  His last visit to this clinic was 11 July 2017.  Since starting dupilumab his atopic disease has melted away.  He has no issues with his skin and no issues with his nose and no issues with his chest and does not use any controller agents for asthma or his nose and does not use a short acting bronchodilator and has no limitation in his ability to exercise.  He has not required a systemic steroid or antibiotic to treat any type of respiratory or skin problem.  He was having some problem with his eyes last time he was seen in his clinic believed secondary to exposure to a dog that was introduced into the house.  Since he has remove the dog from the bedroom his eyes are better and he does not need to use any topical agents.  His reflux is under very good control at this point time while utilizing omeprazole and he does not need to use ranitidine.  Allergies as of 11/14/2017   No Known Allergies     Medication List      budesonide-formoterol 160-4.5 MCG/ACT inhaler Commonly known as:  SYMBICORT Inhale two puffs twice daily to prevent cough or wheeze.  Rinse, gargle, and spit after use.   DUPIXENT 300 MG/2ML Sosy Generic drug:  Dupilumab INJECT CONTENTS OF 1 SYRINGE(300MG ) SUBCUTANEOUSLY EVERY 14 DAYS   EPIPEN 2-PAK 0.3 mg/0.3 mL Soaj injection Generic drug:  EPINEPHrine Inject 0.3 mg into the muscle once.   ibuprofen 800 MG tablet Commonly known as:  ADVIL,MOTRIN Take 1 tablet (800 mg total) by mouth 3 (three) times daily.     montelukast 10 MG tablet Commonly known as:  SINGULAIR Take 1 tablet (10 mg total) by mouth at bedtime.   olopatadine 0.1 % ophthalmic solution Commonly known as:  PATANOL Place 1 drop into both eyes 2 (two) times daily.   omeprazole 40 MG capsule Commonly known as:  PRILOSEC TAKE 1 CAPSULE BY MOUTH EVERY MORNING.   pravastatin 80 MG tablet Commonly known as:  PRAVACHOL Take 80 mg by mouth daily.   ranitidine 300 MG tablet Commonly known as:  ZANTAC Take 1 tablet (300 mg total) by mouth at bedtime.   sertraline 50 MG tablet Commonly known as:  ZOLOFT   VENTOLIN HFA 108 (90 Base) MCG/ACT inhaler Generic drug:  albuterol Inhale 2 puffs into the lungs every 4 (four) hours as needed for wheezing or shortness of breath.       Past Medical History:  Diagnosis Date  . Allergic rhinitis   . Asthma   . Depression   . GERD (gastroesophageal reflux disease)   . Lumbar herniated disc   . Urticaria     Past Surgical History:  Procedure Laterality Date  . CHOLECYSTECTOMY    . SHOULDER OPEN ROTATOR CUFF REPAIR  07/31/2017    Review of systems negative except as noted in HPI / PMHx or noted below:  Review of Systems  Constitutional: Negative.   HENT: Negative.   Eyes: Negative.   Respiratory: Negative.   Cardiovascular: Negative.   Gastrointestinal:  Negative.   Genitourinary: Negative.   Musculoskeletal: Negative.   Skin: Negative.   Neurological: Negative.   Endo/Heme/Allergies: Negative.   Psychiatric/Behavioral: Negative.      Objective:   Vitals:   11/14/17 1523  BP: 122/80  Pulse: 97  Resp: 20  Temp: 98.2 F (36.8 C)  SpO2: 94%   Height: 5' 8.5" (174 cm)  Weight: 182 lb (82.6 kg)   Physical Exam  HENT:  Head: Normocephalic.  Right Ear: Tympanic membrane, external ear and ear canal normal.  Left Ear: Tympanic membrane, external ear and ear canal normal.  Nose: Nose normal. No mucosal edema or rhinorrhea.  Mouth/Throat: Uvula is midline,  oropharynx is clear and moist and mucous membranes are normal. No oropharyngeal exudate.  Eyes: Conjunctivae are normal.  Neck: Trachea normal. No tracheal tenderness present. No tracheal deviation present. No thyromegaly present.  Cardiovascular: Normal rate, regular rhythm, S1 normal, S2 normal and normal heart sounds.  No murmur heard. Pulmonary/Chest: Breath sounds normal. No stridor. No respiratory distress. He has no wheezes. He has no rales.  Musculoskeletal: He exhibits no edema.  Lymphadenopathy:       Head (right side): No tonsillar adenopathy present.       Head (left side): No tonsillar adenopathy present.    He has no cervical adenopathy.  Neurological: He is alert.  Skin: No rash noted. He is not diaphoretic. No erythema. Nails show no clubbing.    Diagnostics: 3.15  Spirometry was performed and demonstrated an FEV1 of 3.78 at 94 % of predicted.    Assessment and Plan:   1. Asthma, moderate persistent, well-controlled   2. Other allergic rhinitis   3. Other atopic dermatitis   4. Allergic urticaria   5. LPRD (laryngopharyngeal reflux disease)      1. Continue Dupilumab injections.    2. Can restart the following if asthma flare:   A. Symbicort 160 - 2 inhalations 1-2 times per day  B. Montelukast 10 mg - one tablet one time per day  C. Ventolin HFA 2 inhalations every 4-6 hours if needed  3. Continue to Treat and prevent reflux:   A. omeprazole 40 mg in AM  B. Ranitidine 300 mg in PM if needed  4. If needed:   A. OTC Mucinex DM 2 tablet two times per day  B. Zyrtec 10 mg one tablet one time per day  C. Epi-pen  D. PATANOL - one drop each eye two times per day  5. Return to clinic 6 months or earlier if problem    6. Obtain fall flu vaccine  Douglas HeckRandy has had a dramatic response to the use of dupilumab and it has made his atopic disease almost become invisible.  He will now utilize a collection of medical therapy directed against atopic disease for the most  part on an as-needed basis and he will also continue to treat his reflux disease with therapy noted above.  I will see him back in this clinic in 6 months or earlier if there is a problem.  Laurette SchimkeEric Katha Kuehne, MD Allergy / Immunology [facility]

## 2017-11-15 ENCOUNTER — Encounter: Payer: Self-pay | Admitting: Allergy and Immunology

## 2017-11-17 DIAGNOSIS — M25571 Pain in right ankle and joints of right foot: Secondary | ICD-10-CM | POA: Diagnosis not present

## 2017-12-11 DIAGNOSIS — M67471 Ganglion, right ankle and foot: Secondary | ICD-10-CM | POA: Diagnosis not present

## 2017-12-11 DIAGNOSIS — G5751 Tarsal tunnel syndrome, right lower limb: Secondary | ICD-10-CM | POA: Diagnosis not present

## 2017-12-11 DIAGNOSIS — M79671 Pain in right foot: Secondary | ICD-10-CM | POA: Diagnosis not present

## 2017-12-14 ENCOUNTER — Encounter: Payer: 59 | Admitting: Diagnostic Neuroimaging

## 2017-12-19 DIAGNOSIS — M75101 Unspecified rotator cuff tear or rupture of right shoulder, not specified as traumatic: Secondary | ICD-10-CM | POA: Diagnosis not present

## 2017-12-26 DIAGNOSIS — M67471 Ganglion, right ankle and foot: Secondary | ICD-10-CM | POA: Diagnosis not present

## 2017-12-26 DIAGNOSIS — G5751 Tarsal tunnel syndrome, right lower limb: Secondary | ICD-10-CM | POA: Diagnosis not present

## 2018-01-17 ENCOUNTER — Other Ambulatory Visit: Payer: Self-pay | Admitting: Allergy and Immunology

## 2018-03-09 DIAGNOSIS — Z23 Encounter for immunization: Secondary | ICD-10-CM | POA: Diagnosis not present

## 2018-03-09 DIAGNOSIS — R972 Elevated prostate specific antigen [PSA]: Secondary | ICD-10-CM | POA: Diagnosis not present

## 2018-03-09 DIAGNOSIS — E7800 Pure hypercholesterolemia, unspecified: Secondary | ICD-10-CM | POA: Diagnosis not present

## 2018-03-09 DIAGNOSIS — K219 Gastro-esophageal reflux disease without esophagitis: Secondary | ICD-10-CM | POA: Diagnosis not present

## 2018-05-08 ENCOUNTER — Ambulatory Visit: Payer: 59 | Admitting: Allergy and Immunology

## 2018-06-19 ENCOUNTER — Other Ambulatory Visit: Payer: Self-pay | Admitting: Allergy and Immunology

## 2018-06-20 ENCOUNTER — Other Ambulatory Visit: Payer: Self-pay | Admitting: *Deleted

## 2018-06-20 MED ORDER — OMEPRAZOLE 40 MG PO CPDR: 40.0000 mg | DELAYED_RELEASE_CAPSULE | Freq: Every morning | ORAL | 0 refills | Status: DC

## 2018-06-20 NOTE — Telephone Encounter (Signed)
Refill for omeprazole courtesy must have ov for further refills

## 2018-07-19 ENCOUNTER — Telehealth: Payer: Self-pay | Admitting: Allergy and Immunology

## 2018-07-19 NOTE — Telephone Encounter (Signed)
Patient is calling about DUPIXENT Patient has skin issues It has been awhile since he was seen due to insurance and finances Patient now has new insurance Wants to know the cost of coming back in and restarting his dupixent Wants to know what his out of pocket and appts will cost  [employer] ID# [id number] GRP# [id number]  Please call patient with any questions - [phone]

## 2018-07-19 NOTE — Telephone Encounter (Signed)
He will appt for new insurance to document his baseline atopic dermatitis disease

## 2018-07-19 NOTE — Telephone Encounter (Signed)
Patient advised and appt made to come into clinic for next week in [place]

## 2018-07-19 NOTE — Telephone Encounter (Signed)
L/M for patient to contact me to discuss

## 2018-07-24 ENCOUNTER — Other Ambulatory Visit: Payer: Self-pay

## 2018-07-24 ENCOUNTER — Ambulatory Visit (INDEPENDENT_AMBULATORY_CARE_PROVIDER_SITE_OTHER): Payer: BLUE CROSS/BLUE SHIELD | Admitting: Allergy and Immunology

## 2018-07-24 ENCOUNTER — Encounter: Payer: Self-pay | Admitting: Allergy and Immunology

## 2018-07-24 VITALS — BP 110/70 | HR 80 | Resp 18 | Ht 70.0 in | Wt 185.0 lb

## 2018-07-24 DIAGNOSIS — K219 Gastro-esophageal reflux disease without esophagitis: Secondary | ICD-10-CM | POA: Diagnosis not present

## 2018-07-24 DIAGNOSIS — L2089 Other atopic dermatitis: Secondary | ICD-10-CM

## 2018-07-24 DIAGNOSIS — J4540 Moderate persistent asthma, uncomplicated: Secondary | ICD-10-CM | POA: Diagnosis not present

## 2018-07-24 DIAGNOSIS — J3089 Other allergic rhinitis: Secondary | ICD-10-CM

## 2018-07-24 MED ORDER — MONTELUKAST SODIUM 10 MG PO TABS: 10.0000 mg | ORAL_TABLET | Freq: Every day | ORAL | 5 refills | Status: DC

## 2018-07-24 MED ORDER — ALBUTEROL SULFATE HFA 108 (90 BASE) MCG/ACT IN AERS: 2.0000 | INHALATION_SPRAY | RESPIRATORY_TRACT | 1 refills | Status: DC | PRN

## 2018-07-24 MED ORDER — DUPIXENT 300 MG/2ML ~~LOC~~ SOSY: 600.0000 mg | PREFILLED_SYRINGE | Freq: Once | SUBCUTANEOUS | 0 refills | Status: AC

## 2018-07-24 MED ORDER — OMEPRAZOLE 40 MG PO CPDR: 40.0000 mg | DELAYED_RELEASE_CAPSULE | Freq: Every morning | ORAL | 5 refills | Status: DC

## 2018-07-24 MED ORDER — OLOPATADINE HCL 0.1 % OP SOLN: 1.0000 [drp] | Freq: Two times a day (BID) | OPHTHALMIC | 5 refills | Status: DC

## 2018-07-24 MED ORDER — BUDESONIDE-FORMOTEROL FUMARATE 160-4.5 MCG/ACT IN AERO: INHALATION_SPRAY | RESPIRATORY_TRACT | 5 refills | Status: DC

## 2018-07-24 NOTE — Progress Notes (Signed)
- [place] [facility] [place] [facility] [place] [facility] [place] [facility]   Follow-up Note  Referring Provider: Lonie Peak, PA-C Primary Provider: Lonie Peak, Cordelia Poche Date of Office Visit: 07/24/2018  Subjective:   Douglas Carroll (DOB: 10-26-1955) is a 63 y.o. male who [facility] in re-evaluation of the following:  HPI: Douglas Carroll presents to this clinic in evaluation of asthma and allergic rhinoconjunctivitis and atopic dermatitis and urticaria treated with dupilumab and a history of LPR.  I have not seen him in this clinic since 14 November 2017.  In the past he had a amazing response to dupilumab with complete elimination of his atopic dermatitis and any respiratory atopic symptomatology and basically did not use any controller medications after starting dupilumab last year.  He did not require systemic steroid or an antibiotic for any type of airway issue and rarely used any short acting bronchodilator and had no need to use any topical agents.  Unfortunately, because of a insurance issue he had to discontinue dupilumab at the beginning of this year.  For the past month he has starting to redevelop significant problems with atopic dermatitis especially involving his face and arms and lower extremities.  Fortunately, he has not developed any problems with his nose or eyes at this point in time although certainly based upon his history he is very worried that as the pollen count goes up he will develop significant problems with his respiratory tract.  His reflux is under very good control at this point with omeprazole.  He did receive the flu vaccine this year.  Allergies as of 07/24/2018   No Known Allergies     Medication List      EpiPen 2-Pak 0.3 mg/0.3 mL Soaj injection Generic drug:  EPINEPHrine Inject 0.3 mg into the muscle once.   ibuprofen 800 MG tablet Commonly known as:  ADVIL,MOTRIN Take 1 tablet (800 mg total) by mouth 3 (three) times  daily.   omeprazole 40 MG capsule Commonly known as:  PRILOSEC Take 1 capsule (40 mg total) by mouth every morning.   pravastatin 80 MG tablet Commonly known as:  PRAVACHOL Take 80 mg by mouth daily.   sertraline 50 MG tablet Commonly known as:  ZOLOFT       Past Medical History:  Diagnosis Date  . Allergic rhinitis   . Asthma   . Depression   . GERD (gastroesophageal reflux disease)   . Lumbar herniated disc   . Urticaria     Past Surgical History:  Procedure Laterality Date  . ANKLE SURGERY    . CHOLECYSTECTOMY    . SHOULDER OPEN ROTATOR CUFF REPAIR  07/31/2017    Review of systems negative except as noted in HPI / PMHx or noted below:  Review of Systems  Constitutional: Negative.   HENT: Negative.   Eyes: Negative.   Respiratory: Negative.   Cardiovascular: Negative.   Gastrointestinal: Negative.   Genitourinary: Negative.   Musculoskeletal: Negative.   Skin: Negative.   Neurological: Negative.   Endo/Heme/Allergies: Negative.   Psychiatric/Behavioral: Negative.      Objective:   Vitals:   07/24/18 1049  BP: 110/70  Pulse: 80  Resp: 18  SpO2: 97%   Height: 5\' 10"  (177.8 cm)  Weight: 185 lb (83.9 kg)   Physical Exam Constitutional:      Appearance: He is not diaphoretic.  HENT:     Head: Normocephalic.     Right Ear: Tympanic membrane, ear canal and  external ear normal.     Left Ear: Tympanic membrane, ear canal and external ear normal.     Nose: Nose normal. No mucosal edema or rhinorrhea.     Mouth/Throat:     Pharynx: Uvula midline. No oropharyngeal exudate.  Eyes:     Conjunctiva/sclera: Conjunctivae normal.  Neck:     Thyroid: No thyromegaly.     Trachea: Trachea normal. No tracheal tenderness or tracheal deviation.  Cardiovascular:     Rate and Rhythm: Normal rate and regular rhythm.     Heart sounds: Normal heart sounds, S1 normal and S2 normal. No murmur.  Pulmonary:     Effort: No respiratory distress.     Breath sounds:  Normal breath sounds. No stridor. No wheezing or rales.  Lymphadenopathy:     Head:     Right side of head: No tonsillar adenopathy.     Left side of head: No tonsillar adenopathy.     Cervical: No cervical adenopathy.  Skin:    Findings: Rash (Erythematous patches involving face, upper extremity, lower extremity.) present. No erythema.     Nails: There is no clubbing.   Neurological:     Mental Status: He is alert.     Diagnostics: none  Assessment and Plan:   1. Other atopic dermatitis   2. Asthma, moderate persistent, well-controlled   3. Other allergic rhinitis   4. LPRD (laryngopharyngeal reflux disease)      1.  Dupilumab injection today and every 2 weeks.  Have Tammy work through Music therapist  2. Can restart the following if asthma flare:   A. Symbicort 160 - 2 inhalations 1-2 times per day  B. Montelukast 10 mg - one tablet one time per day  C. Ventolin HFA 2 inhalations every 4-6 hours if needed  3. Continue to Treat and prevent reflux:   A. omeprazole 40 mg in AM  4. If needed:   A. OTC Mucinex DM 2 tablet two times per day  B. Zyrtec 10 mg one tablet one time per day  C. Epi-pen  D. PATANOL - one drop each eye two times per day  5. Return to clinic 6 months or earlier if problem    Douglas Carroll is an obvious responder to the administration of dupilumab when it is available for use and now that he has been away from this medication for the past 2 months or so he has developed very significant problems once again with his atopic dermatitis and I suspect that as he goes through this upcoming springtime season his atopic dermatitis is going to blossom across his entire body and he will also develop atopic respiratory symptoms involving both his upper and lower airway and his conjunctiva.  We will attempt to get insurance approval for dupilumab and I have given him an sample injection of this medication today.  He has several other medications that  he can utilize as noted above should he begin to develop significant respiratory issues.  He will remain on therapy for reflux.  I will see him back in this clinic in 6 months or earlier if there is a problem.  Laurette Schimke, MD Allergy / Immunology [facility]

## 2018-07-24 NOTE — Patient Instructions (Signed)
    1.  Dupilumab injection today and every 2 weeks.  Have Tammy work through Music therapist  2. Can restart the following if asthma flare:   A. Symbicort 160 - 2 inhalations 1-2 times per day  B. Montelukast 10 mg - one tablet one time per day  C. Ventolin HFA 2 inhalations every 4-6 hours if needed  3. Continue to Treat and prevent reflux:   A. omeprazole 40 mg in AM  4. If needed:   A. OTC Mucinex DM 2 tablet two times per day  B. Zyrtec 10 mg one tablet one time per day  C. Epi-pen  D. PATANOL - one drop each eye two times per day  5. Return to clinic 6 months or earlier if problem

## 2018-07-31 ENCOUNTER — Encounter: Payer: Self-pay | Admitting: Allergy and Immunology

## 2018-08-07 ENCOUNTER — Ambulatory Visit: Payer: Self-pay

## 2018-09-12 DIAGNOSIS — Z1331 Encounter for screening for depression: Secondary | ICD-10-CM | POA: Diagnosis not present

## 2018-09-12 DIAGNOSIS — F418 Other specified anxiety disorders: Secondary | ICD-10-CM | POA: Diagnosis not present

## 2018-09-12 DIAGNOSIS — K219 Gastro-esophageal reflux disease without esophagitis: Secondary | ICD-10-CM | POA: Diagnosis not present

## 2018-09-12 DIAGNOSIS — Z0000 Encounter for general adult medical examination without abnormal findings: Secondary | ICD-10-CM | POA: Diagnosis not present

## 2018-09-12 DIAGNOSIS — Z79899 Other long term (current) drug therapy: Secondary | ICD-10-CM | POA: Diagnosis not present

## 2018-09-12 DIAGNOSIS — E7800 Pure hypercholesterolemia, unspecified: Secondary | ICD-10-CM | POA: Diagnosis not present

## 2018-10-25 DIAGNOSIS — H6981 Other specified disorders of Eustachian tube, right ear: Secondary | ICD-10-CM | POA: Diagnosis not present

## 2018-10-25 DIAGNOSIS — Z6826 Body mass index (BMI) 26.0-26.9, adult: Secondary | ICD-10-CM | POA: Diagnosis not present

## 2019-02-19 ENCOUNTER — Encounter: Payer: Self-pay | Admitting: Allergy and Immunology

## 2019-02-19 ENCOUNTER — Ambulatory Visit (INDEPENDENT_AMBULATORY_CARE_PROVIDER_SITE_OTHER): Payer: BC Managed Care – PPO | Admitting: Allergy and Immunology

## 2019-02-19 ENCOUNTER — Other Ambulatory Visit: Payer: Self-pay

## 2019-02-19 VITALS — BP 132/82 | HR 73 | Temp 97.0°F | Resp 18 | Ht 69.0 in

## 2019-02-19 DIAGNOSIS — L2089 Other atopic dermatitis: Secondary | ICD-10-CM | POA: Diagnosis not present

## 2019-02-19 DIAGNOSIS — L500 Allergic urticaria: Secondary | ICD-10-CM | POA: Diagnosis not present

## 2019-02-19 DIAGNOSIS — J4540 Moderate persistent asthma, uncomplicated: Secondary | ICD-10-CM | POA: Diagnosis not present

## 2019-02-19 DIAGNOSIS — J3089 Other allergic rhinitis: Secondary | ICD-10-CM | POA: Diagnosis not present

## 2019-02-19 DIAGNOSIS — K219 Gastro-esophageal reflux disease without esophagitis: Secondary | ICD-10-CM

## 2019-02-19 MED ORDER — EPINEPHRINE 0.3 MG/0.3ML IJ SOAJ: 0.3000 mg | Freq: Once | INTRAMUSCULAR | 1 refills | Status: AC

## 2019-02-19 MED ORDER — MONTELUKAST SODIUM 10 MG PO TABS: 10.0000 mg | ORAL_TABLET | Freq: Every day | ORAL | 5 refills | Status: DC

## 2019-02-19 MED ORDER — ALBUTEROL SULFATE HFA 108 (90 BASE) MCG/ACT IN AERS: 2.0000 | INHALATION_SPRAY | RESPIRATORY_TRACT | 1 refills | Status: DC | PRN

## 2019-02-19 MED ORDER — BUDESONIDE-FORMOTEROL FUMARATE 160-4.5 MCG/ACT IN AERO: INHALATION_SPRAY | RESPIRATORY_TRACT | 5 refills | Status: DC

## 2019-02-19 NOTE — Progress Notes (Signed)
[place]   Follow-up Note  Referring Provider: Cyndi Bender, PA-C Primary Provider: Cyndi Bender, PA-C Date of Office Visit: 02/19/2019  Subjective:   Douglas Carroll (DOB: 1955-08-02) is a 63 y.o. male who returns to the [facility] on 02/19/2019 in re-evaluation of the following:  HPI: Douglas Carroll returns to this clinic in reevaluation of his multiorgan atopic state including asthma and allergic rhinoconjunctivitis and atopic dermatitis and urticaria and a history of LPR.  His last visit to this clinic was 24 July 2018.  As long as he remains on dupilumab he has no problems with his nose or chest or skin and does not need to use any medications including any anti-inflammatory medications for his airway, any topical agents for his skin, antihistamines, short acting bronchodilators or eyedrops.  He is now able to go through each season of the year with no difficulty at all.  If he misses his dupilumab for 2 consecutive injections he starts to get inflamed skin which is then quickly followed by issues with asthma.  His reflux is under very good control at this point in time on omeprazole.  Allergies as of 02/19/2019   No Known Allergies     Medication List      albuterol 108 (90 Base) MCG/ACT inhaler Commonly known as: Ventolin HFA Inhale 2 puffs into the lungs every 4 (four) hours as needed for wheezing or shortness of breath.   budesonide-formoterol 160-4.5 MCG/ACT inhaler Commonly known as: Symbicort Inhale two puffs twice daily to prevent cough or wheeze.  Rinse, gargle, and spit after use.   Dupixent 300 MG/2ML prefilled syringe Generic drug: dupilumab Inject 1 pen/syringe subcutaneously every other week   EpiPen 2-Pak 0.3 mg/0.3 mL Soaj injection Generic drug: EPINEPHrine Inject 0.3 mg into the muscle once.   ibuprofen 800 MG tablet Commonly known as: ADVIL Take 1 tablet (800 mg total) by mouth 3  (three) times daily.   montelukast 10 MG tablet Commonly known as: SINGULAIR Take 1 tablet (10 mg total) by mouth at bedtime.   olopatadine 0.1 % ophthalmic solution Commonly known as: PATANOL Place 1 drop into both eyes 2 (two) times daily.   omeprazole 40 MG capsule Commonly known as: PRILOSEC Take 1 capsule (40 mg total) by mouth every morning.   pravastatin 80 MG tablet Commonly known as: PRAVACHOL Take 80 mg by mouth daily.   sertraline 50 MG tablet Commonly known as: ZOLOFT       Past Medical History:  Diagnosis Date  . Allergic rhinitis   . Asthma   . Depression   . GERD (gastroesophageal reflux disease)   . Lumbar herniated disc   . Urticaria     Past Surgical History:  Procedure Laterality Date  . ANKLE SURGERY    . CHOLECYSTECTOMY    . SHOULDER OPEN ROTATOR CUFF REPAIR  07/31/2017    Review of systems negative except as noted in HPI / PMHx or noted below:  Review of Systems  Constitutional: Negative.   HENT: Negative.   Eyes: Negative.   Respiratory: Negative.   Cardiovascular: Negative.   Gastrointestinal: Negative.   Genitourinary: Negative.   Musculoskeletal: Negative.   Skin: Negative.   Neurological: Negative.   Endo/Heme/Allergies: Negative.   Psychiatric/Behavioral: Negative.      Objective:   Vitals:   02/19/19 1122  BP: 132/82  Pulse: 73  Resp: 18  Temp: (!) 97 F (36.1 C)  SpO2: 97%  Height: 5\' 9"  (175.3 cm)      Physical Exam Constitutional:      Appearance: He is not diaphoretic.  HENT:     Head: Normocephalic.     Right Ear: Tympanic membrane, ear canal and external ear normal.     Left Ear: Tympanic membrane, ear canal and external ear normal.     Nose: Nose normal. No mucosal edema or rhinorrhea.     Mouth/Throat:     Pharynx: Uvula midline. No oropharyngeal exudate.  Eyes:     Conjunctiva/sclera: Conjunctivae normal.  Neck:     Thyroid: No thyromegaly.     Trachea: Trachea normal. No tracheal tenderness  or tracheal deviation.  Cardiovascular:     Rate and Rhythm: Normal rate and regular rhythm.     Heart sounds: Normal heart sounds, S1 normal and S2 normal. No murmur.  Pulmonary:     Effort: No respiratory distress.     Breath sounds: Normal breath sounds. No stridor. No wheezing or rales.  Lymphadenopathy:     Head:     Right side of head: No tonsillar adenopathy.     Left side of head: No tonsillar adenopathy.     Cervical: No cervical adenopathy.  Skin:    Findings: No erythema or rash.     Nails: There is no clubbing.   Neurological:     Mental Status: He is alert.     Diagnostics:    Spirometry was performed and demonstrated an FEV1 of 3.22 at 96 % of predicted.  Assessment and Plan:   1. Asthma, moderate persistent, well-controlled   2. Other allergic rhinitis   3. Other atopic dermatitis   4. Allergic urticaria   5. LPRD (laryngopharyngeal reflux disease)      1.  Continue dupilumab injection today and every 2 weeks.     2.  Continue to Treat and prevent reflux:   A. omeprazole 40 mg in AM  3. If needed:   A. Zyrtec 10 mg one tablet one time per day  B. Epi-pen  4. Can restart the following if asthma flare:   A. Symbicort 160 - 2 inhalations 1-2 times per day  B. Montelukast 10 mg - one tablet one time per day  C. Ventolin HFA 2 inhalations every 4-6 hours if needed  5. Return to clinic 6 months or earlier if problem    6.  Obtain fall flu vaccine (and Covid vaccine)  has continued to do very well while utilizing dupilumab regarding his multiorgan atopic disease and he will remain on this agent as his only controller agent at this point.  I will see him back in his clinic in 6 months or earlier if there is a problem.  [name], MD Allergy / Immunology [facility]

## 2019-02-19 NOTE — Patient Instructions (Addendum)
    1.  Continue dupilumab injection today and every 2 weeks.     2.  Continue to Treat and prevent reflux:   A. omeprazole 40 mg in AM  3. If needed:   A. Zyrtec 10 mg one tablet one time per day  B. Epi-pen  4. Can restart the following if asthma flare:   A. Symbicort 160 - 2 inhalations 1-2 times per day  B. Montelukast 10 mg - one tablet one time per day  C. Ventolin HFA 2 inhalations every 4-6 hours if needed  5. Return to clinic 6 months or earlier if problem    6.  Obtain fall flu vaccine (and Covid vaccine)

## 2019-02-20 ENCOUNTER — Encounter: Payer: Self-pay | Admitting: Allergy and Immunology

## 2019-03-09 IMAGING — MR MR PROSTATE WO/W CM
23 of 54 series · 23 of 54 positions shown · IV contrast (yes)
Comparison: CT 09/21/2016

CLINICAL DATA: Elevated PSA equal 9.1. Negative prostate biopsy
06/19/2015

EXAM:
MR PROSTATE WITHOUT AND WITH CONTRAST
TECHNIQUE: Multiplanar multisequence MRI images were obtained of the pelvis
centered about the prostate. Pre and post contrast images were
obtained.
CONTRAST:  17mL MULTIHANCE GADOBENATE DIMEGLUMINE 529 MG/ML IV SOLN

[Series 5: bSSFP fat-sat · axial · 6.0mm · 0.86mm/px · 1 of 44 slices shown]
[im 1/44]
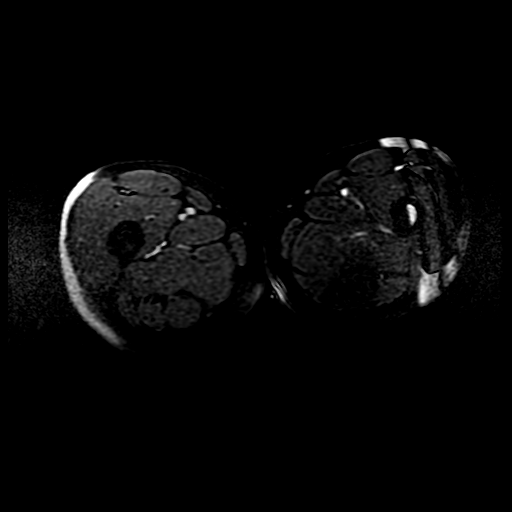

[Series 6: T1 · axial · 6.0mm · 0.86mm/px · 1 of 44 slices shown (1 of 2)]
[im 1/44]
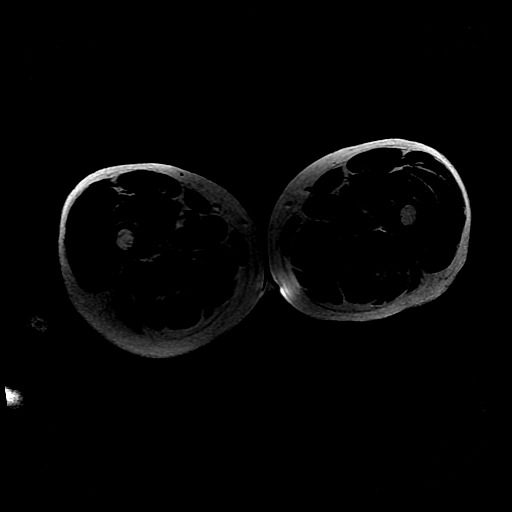

[Series 7: T1 · axial · 3.0mm · 0.29mm/px · 1 of 24 slices shown (2 of 2)]
[im 1/24]
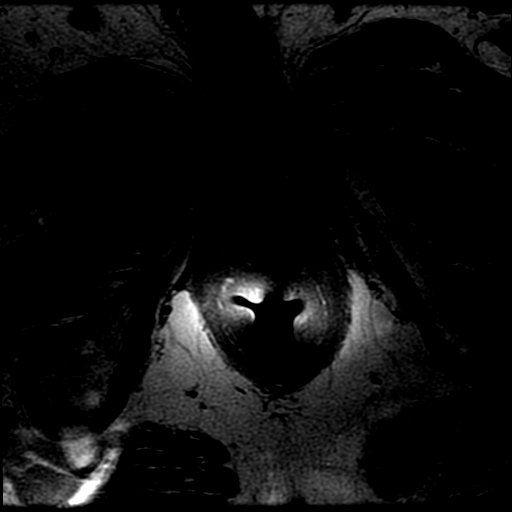

[Series 8: T2 · axial · 3.0mm · 0.29mm/px · 1 of 24 slices shown (1 of 4)]
[im 1/24]
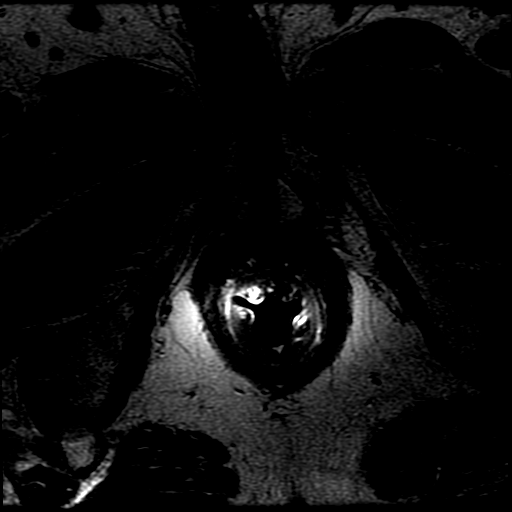

[Series 9: T2 · axial · 1.8mm · 0.47mm/px · 1 of 156 slices shown (2 of 4)]
[im 1/156]
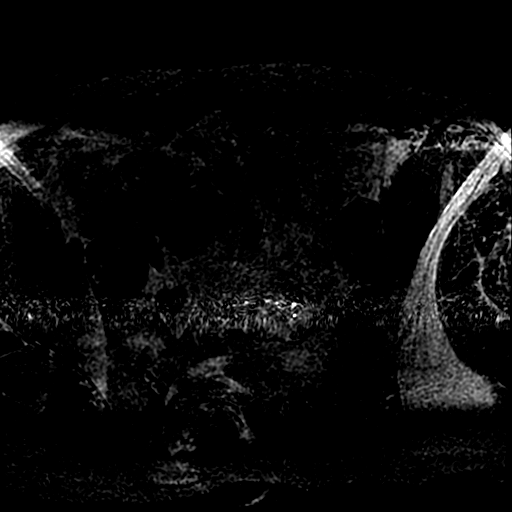

[Series 10: T2 · sagittal · 4.0mm · 0.29mm/px · 1 of 24 slices shown (3 of 4)]
[im 1/24]
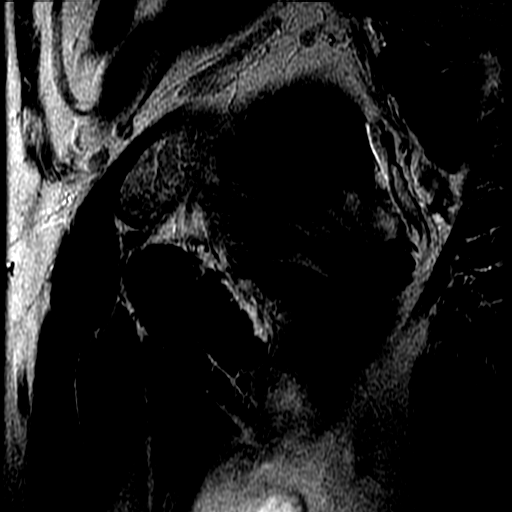

[Series 11: T2 · coronal · 4.0mm · 0.29mm/px · 1 of 24 slices shown (4 of 4)]
[im 1/24]
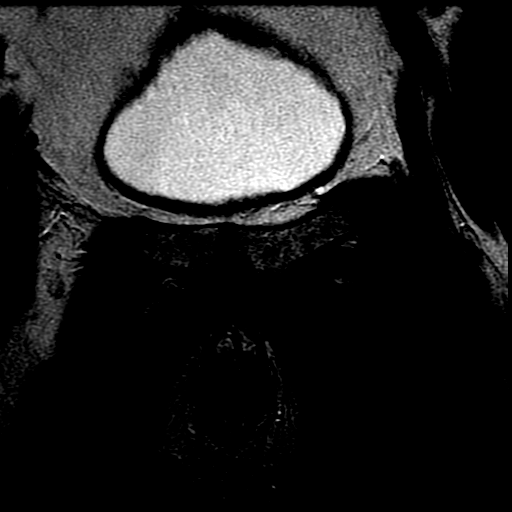

[Series 12: DWI · axial · 3.0mm · 0.59mm/px · 1 of 48 slices shown (1 of 6)]
[im 1/48]
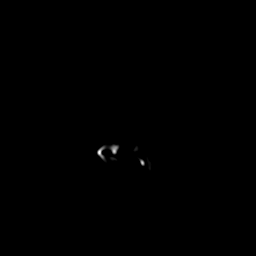

[Series 13: DWI · axial · 3.0mm · 0.59mm/px · 1 of 46 slices shown (2 of 6)]
[im 1/46]
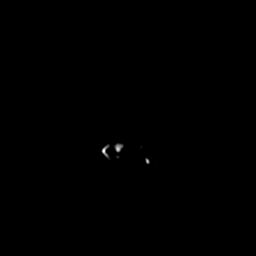

[Series 14: DWI · axial · 3.0mm · 0.59mm/px · 1 of 46 slices shown (3 of 6)]
[im 1/46]
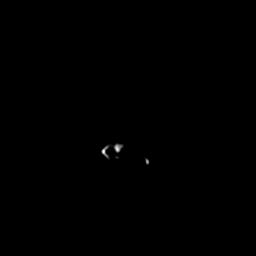

[Series 1200: DWI · axial · 3.0mm · 0.59mm/px · 1 of 24 slices shown (4 of 6)]
[im 1/24]
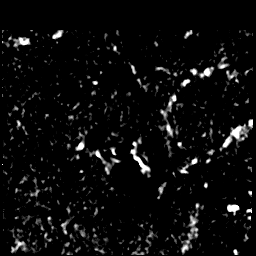

[Series 1300: DWI · axial · 3.0mm · 0.59mm/px · 1 of 24 slices shown (5 of 6)]
[im 1/24]
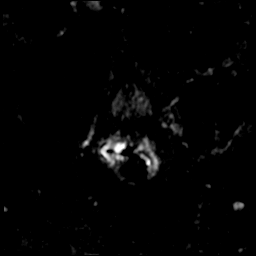

[Series 1400: DWI · axial · 3.0mm · 0.59mm/px · 1 of 24 slices shown (6 of 6)]
[im 1/24]
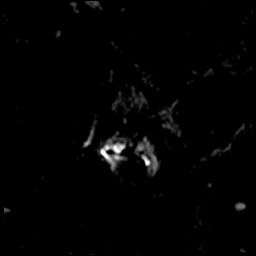

[((id)/(id)/1)-((id)/(id)/1) · axial · 3.0mm · 0.43mm/px · 1 of 93 slices shown (1 of 10)]
[im 1/93]
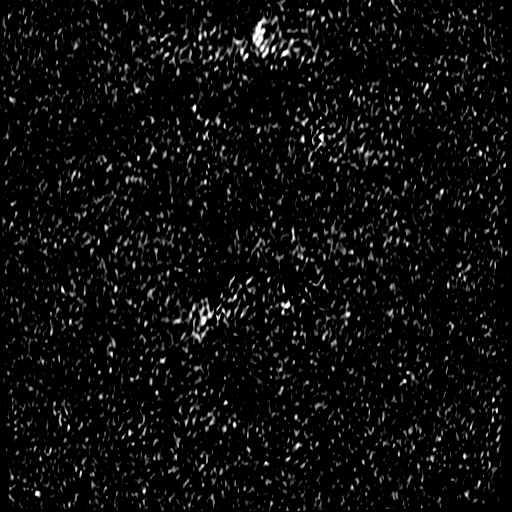

[((id)/(id)/1)-((id)/(id)/1) · axial · 3.0mm · 0.43mm/px · 1 of 96 slices shown (2 of 10)]
[im 1/96]
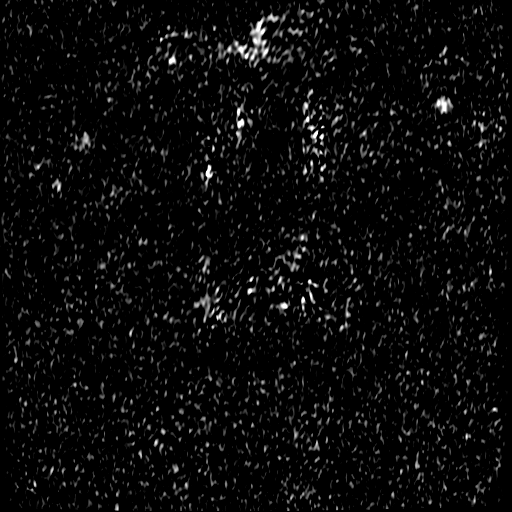

[((id)/(id)/1)-((id)/(id)/1) · axial · 3.0mm · 0.43mm/px · 1 of 96 slices shown (3 of 10)]
[im 1/96]
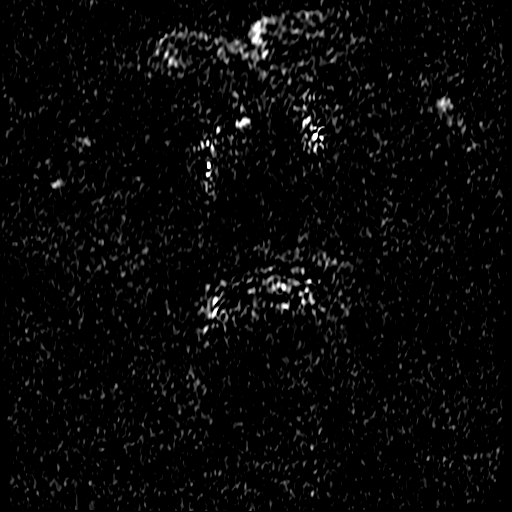

[((id)/(id)/1)-((id)/(id)/1) · axial · 3.0mm · 0.43mm/px · 1 of 96 slices shown (4 of 10)]
[im 1/96]
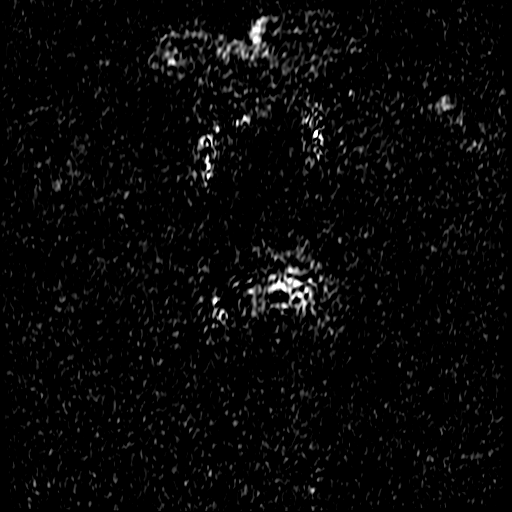

[((id)/(id)/1)-((id)/(id)/1) · axial · 3.0mm · 0.43mm/px · 1 of 96 slices shown (5 of 10)]
[im 1/96]
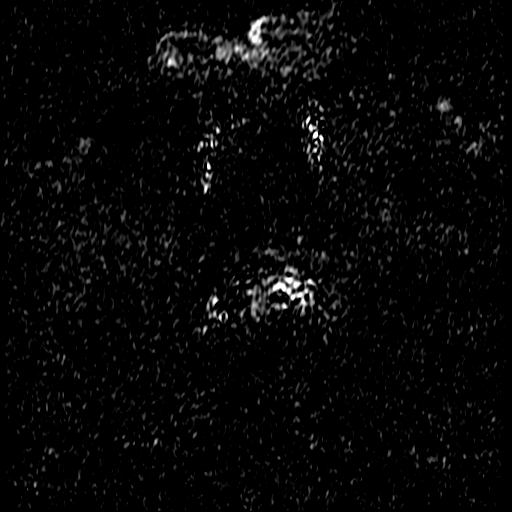

[((id)/(id)/1)-((id)/(id)/1) · axial · 3.0mm · 0.43mm/px · 1 of 96 slices shown (6 of 10)]
[im 1/96]
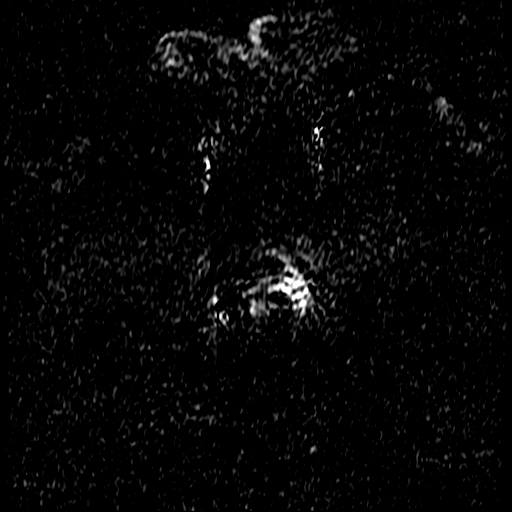

[((id)/(id)/1)-((id)/(id)/1) · axial · 3.0mm · 0.43mm/px · 1 of 96 slices shown (7 of 10)]
[im 1/96]
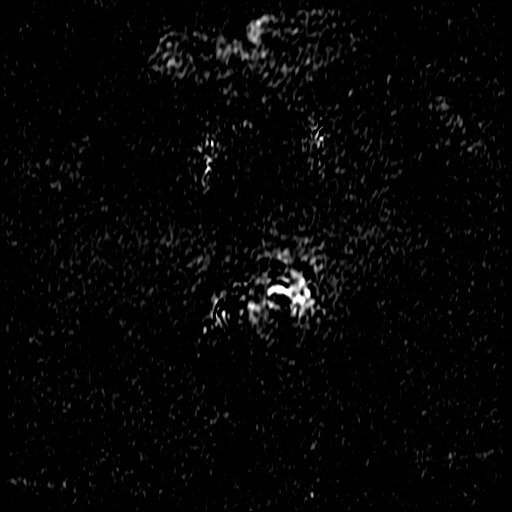

[((id)/(id)/1)-((id)/(id)/1) · axial · 3.0mm · 0.43mm/px · 1 of 96 slices shown (8 of 10)]
[im 1/96]
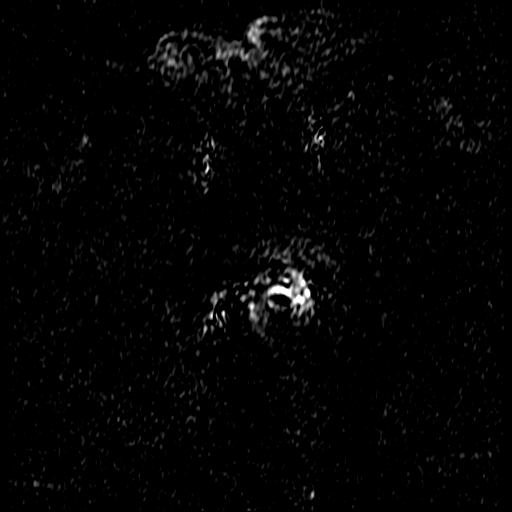

[((id)/(id)/1)-((id)/(id)/1) · axial · 3.0mm · 0.43mm/px · 1 of 96 slices shown (9 of 10)]
[im 1/96]
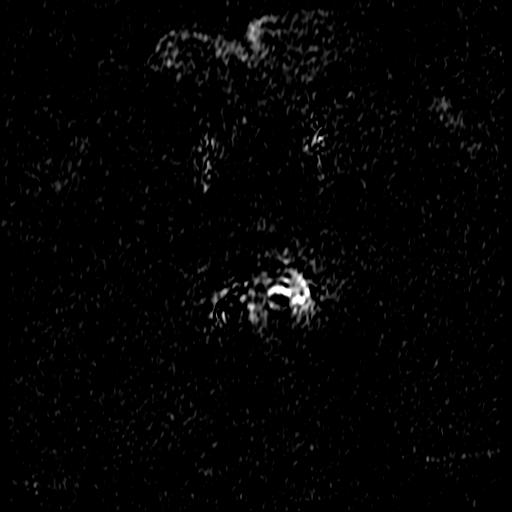

[((id)/(id)/1)-((id)/(id)/1) · axial · 3.0mm · 0.43mm/px · 1 of 96 slices shown (10 of 10)]
[im 1/96]
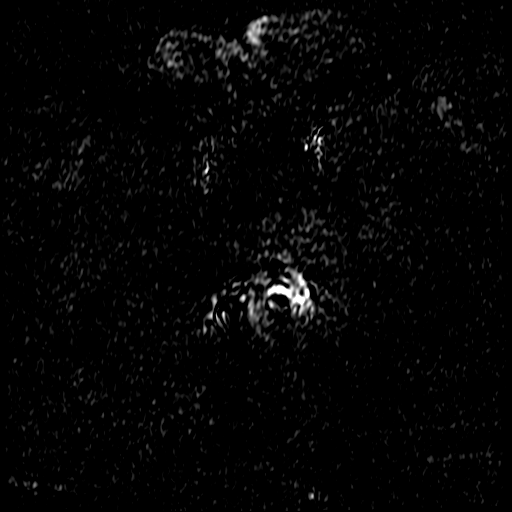

[23 of 54 positions shown; findings below may reference images not displayed]

FINDINGS: Prostate: There is heterogeneous signal intensity in the curls on T2
weighted imaging without focal lesion. No restricted diffusion
evident (series 1588).

Transitional zone as well capsulated nodules.

The RIGHT seminal vesicle is atrophic.  The seminal vesicles normal.

Volume: 4.5 by 3.6 by 5.4 cm (volume = 46 cm^3)

Transcapsular spread:  Absent

Seminal vesicle involvement: Absent

Neurovascular bundle involvement: Absent

Pelvic adenopathy: Absent

Bone metastasis: Absent

Other findings: Prostatomegaly
IMPRESSION: 1. No evidence of high-grade carcinoma in the peripheral zone.
2. Mildly enlarged transitional zone with benign-appearing nodules.
3. Mild prostatomegaly.

## 2019-03-15 DIAGNOSIS — Z23 Encounter for immunization: Secondary | ICD-10-CM | POA: Diagnosis not present

## 2019-03-15 DIAGNOSIS — R0602 Shortness of breath: Secondary | ICD-10-CM | POA: Diagnosis not present

## 2019-03-15 DIAGNOSIS — F418 Other specified anxiety disorders: Secondary | ICD-10-CM | POA: Diagnosis not present

## 2019-03-15 DIAGNOSIS — K219 Gastro-esophageal reflux disease without esophagitis: Secondary | ICD-10-CM | POA: Diagnosis not present

## 2019-03-15 DIAGNOSIS — E7800 Pure hypercholesterolemia, unspecified: Secondary | ICD-10-CM | POA: Diagnosis not present

## 2019-05-10 ENCOUNTER — Other Ambulatory Visit: Payer: Self-pay | Admitting: Allergy and Immunology

## 2019-08-20 ENCOUNTER — Ambulatory Visit: Payer: BC Managed Care – PPO | Admitting: Allergy and Immunology

## 2019-09-13 DIAGNOSIS — F418 Other specified anxiety disorders: Secondary | ICD-10-CM | POA: Diagnosis not present

## 2019-09-13 DIAGNOSIS — K219 Gastro-esophageal reflux disease without esophagitis: Secondary | ICD-10-CM | POA: Diagnosis not present

## 2019-09-13 DIAGNOSIS — Z13220 Encounter for screening for lipoid disorders: Secondary | ICD-10-CM | POA: Diagnosis not present

## 2019-09-13 DIAGNOSIS — E7800 Pure hypercholesterolemia, unspecified: Secondary | ICD-10-CM | POA: Diagnosis not present

## 2019-09-13 DIAGNOSIS — Z0000 Encounter for general adult medical examination without abnormal findings: Secondary | ICD-10-CM | POA: Diagnosis not present

## 2019-09-17 ENCOUNTER — Ambulatory Visit (INDEPENDENT_AMBULATORY_CARE_PROVIDER_SITE_OTHER): Payer: BC Managed Care – PPO | Admitting: Allergy and Immunology

## 2019-09-17 ENCOUNTER — Encounter: Payer: Self-pay | Admitting: Allergy and Immunology

## 2019-09-17 ENCOUNTER — Other Ambulatory Visit: Payer: Self-pay

## 2019-09-17 VITALS — BP 136/84 | HR 66 | Temp 97.3°F | Resp 18 | Ht 68.5 in | Wt 180.0 lb

## 2019-09-17 DIAGNOSIS — L500 Allergic urticaria: Secondary | ICD-10-CM

## 2019-09-17 DIAGNOSIS — J4540 Moderate persistent asthma, uncomplicated: Secondary | ICD-10-CM

## 2019-09-17 DIAGNOSIS — L2089 Other atopic dermatitis: Secondary | ICD-10-CM

## 2019-09-17 DIAGNOSIS — J3089 Other allergic rhinitis: Secondary | ICD-10-CM | POA: Diagnosis not present

## 2019-09-17 DIAGNOSIS — K219 Gastro-esophageal reflux disease without esophagitis: Secondary | ICD-10-CM

## 2019-09-17 MED ORDER — ALBUTEROL SULFATE HFA 108 (90 BASE) MCG/ACT IN AERS: 2.0000 | INHALATION_SPRAY | RESPIRATORY_TRACT | 1 refills | Status: DC | PRN

## 2019-09-17 MED ORDER — OMEPRAZOLE 40 MG PO CPDR: 40.0000 mg | DELAYED_RELEASE_CAPSULE | Freq: Every morning | ORAL | 5 refills | Status: DC

## 2019-09-17 MED ORDER — MONTELUKAST SODIUM 10 MG PO TABS: 10.0000 mg | ORAL_TABLET | Freq: Every day | ORAL | 5 refills | Status: DC

## 2019-09-17 MED ORDER — OLOPATADINE HCL 0.1 % OP SOLN: 1.0000 [drp] | Freq: Two times a day (BID) | OPHTHALMIC | 5 refills | Status: DC

## 2019-09-17 MED ORDER — BUDESONIDE-FORMOTEROL FUMARATE 160-4.5 MCG/ACT IN AERO: INHALATION_SPRAY | RESPIRATORY_TRACT | 5 refills | Status: DC

## 2019-09-17 NOTE — Patient Instructions (Signed)
    1.  Continue dupilumab injection every 2 weeks.     2.  Continue to Treat and prevent reflux:   A. omeprazole 40 mg in AM  3. If needed:   A. Zyrtec 10 mg one tablet one time per day  B. Epi-pen  4. Can restart the following if asthma flare:   A. Symbicort 160 - 2 inhalations 1-2 times per day  B. Montelukast 10 mg - one tablet one time per day  C. Ventolin HFA 2 inhalations every 4-6 hours if needed  5. Return to clinic 12 months or earlier if problem

## 2019-09-17 NOTE — Progress Notes (Signed)
[place] [facility] [place] [facility] [place] [facility] [place] [facility] [place] [facility]   Follow-up Note  Referring Provider: Lonie Peak, PA-C Primary Provider: Lonie Peak, Cordelia Poche Date of Office Visit: 09/17/2019  Subjective:   Douglas Carroll (DOB: 1955/06/06) is a 64 y.o. male who [facility] in re-evaluation of the following:  HPI: Douglas Carroll returns to this clinic in evaluation of his multiorgan atopic disease which includes asthma and allergic rhinoconjunctivitis and severe atopic dermatitis and urticaria and history of LPR.  His last visit to this clinic was 19 February 2019.  He uses dupilumab to control his atopic driven immune activation and while doing so has had no problems with his airway or skin and no need to use any medications.  He does not require short acting bronchodilator and can exercise without any difficulty and has not had activate his action plan.  He has not required a systemic steroid or antibiotic for any type of airway or cutaneous abnormality.  His reflux is under very good control with omeprazole.  He did receive his first Pfizer Covid vaccination and will be having a second Covid vaccination at the beginning of June.  Allergies as of 09/17/2019   No Known Allergies     Medication List      albuterol 108 (90 Base) MCG/ACT inhaler Commonly known as: Ventolin HFA Inhale 2 puffs into the lungs every 4 (four) hours as needed for wheezing or shortness of breath.   budesonide-formoterol 160-4.5 MCG/ACT inhaler Commonly known as: Symbicort Inhale two puffs twice daily to prevent cough or wheeze.  Rinse, gargle, and spit after use.   Dupixent 300 MG/2ML prefilled syringe Generic drug: dupilumab Inject 1 pen/syringe subcutaneously every other week   ibuprofen 800 MG tablet Commonly known as: ADVIL Take 1 tablet (800 mg total) by mouth 3 (three) times daily.   montelukast 10 MG tablet Commonly known as: SINGULAIR Take 1 tablet (10 mg  total) by mouth at bedtime.   olopatadine 0.1 % ophthalmic solution Commonly known as: PATANOL Place 1 drop into both eyes 2 (two) times daily.   omeprazole 40 MG capsule Commonly known as: PRILOSEC Take 1 capsule (40 mg total) by mouth every morning.   pravastatin 80 MG tablet Commonly known as: PRAVACHOL Take 80 mg by mouth daily.   sertraline 50 MG tablet Commonly known as: ZOLOFT       Past Medical History:  Diagnosis Date  . Allergic rhinitis   . Asthma   . Depression   . GERD (gastroesophageal reflux disease)   . Lumbar herniated disc   . Urticaria     Past Surgical History:  Procedure Laterality Date  . ANKLE SURGERY    . CHOLECYSTECTOMY    . SHOULDER OPEN ROTATOR CUFF REPAIR  07/31/2017    Review of systems negative except as noted in HPI / PMHx or noted below:  Review of Systems  Constitutional: Negative.   HENT: Negative.   Eyes: Negative.   Respiratory: Negative.   Cardiovascular: Negative.   Gastrointestinal: Negative.   Genitourinary: Negative.   Musculoskeletal: Negative.   Skin: Negative.   Neurological: Negative.   Endo/Heme/Allergies: Negative.   Psychiatric/Behavioral: Negative.      Objective:   Vitals:   09/17/19 0856  BP: 136/84  Pulse: 66  Resp: 18  Temp: (!) 97.3 F (36.3 C)  SpO2: 97%   Height: 5' 8.5" (174 cm)  Weight: 180 lb (81.6 kg)   Physical Exam Constitutional:  Appearance: He is not diaphoretic.  HENT:     Head: Normocephalic.     Right Ear: Tympanic membrane, ear canal and external ear normal.     Left Ear: Tympanic membrane, ear canal and external ear normal.     Nose: Nose normal. No mucosal edema or rhinorrhea.     Mouth/Throat:     Pharynx: Uvula midline. No oropharyngeal exudate.  Eyes:     Conjunctiva/sclera: Conjunctivae normal.  Neck:     Thyroid: No thyromegaly.     Trachea: Trachea normal. No tracheal tenderness or tracheal deviation.  Cardiovascular:     Rate and Rhythm: Normal rate  and regular rhythm.     Heart sounds: Normal heart sounds, S1 normal and S2 normal. No murmur.  Pulmonary:     Effort: No respiratory distress.     Breath sounds: Normal breath sounds. No stridor. No wheezing or rales.  Lymphadenopathy:     Head:     Right side of head: No tonsillar adenopathy.     Left side of head: No tonsillar adenopathy.     Cervical: No cervical adenopathy.  Skin:    Findings: No erythema or rash.     Nails: There is no clubbing.  Neurological:     Mental Status: He is alert.     Diagnostics:    Spirometry was performed and demonstrated an FEV1 of 3.17 at 108 % of predicted.  The patient had an Asthma Control Test with the following results: ACT Total Score: 25.    Assessment and Plan:   1. Asthma, moderate persistent, well-controlled   2. Other allergic rhinitis   3. Other atopic dermatitis   4. Allergic urticaria   5. LPRD (laryngopharyngeal reflux disease)     1.  Continue dupilumab injection every 2 weeks.     2.  Continue to Treat and prevent reflux:   A. omeprazole 40 mg in AM  3. If needed:   A. Zyrtec 10 mg one tablet one time per day  B. Epi-pen  4. Can restart the following if asthma flare:   A. Symbicort 160 - 2 inhalations 1-2 times per day  B. Montelukast 10 mg - one tablet one time per day  C. Ventolin HFA 2 inhalations every 4-6 hours if needed  5. Return to clinic 12 months or earlier if problem    Douglas Carroll has had a life-changing effect from the use of his dupilumab and he will remain on this biological agent to control his multiorgan atopic disease.  I will see him back in this clinic in 1 year or earlier if there is a problem.  It should be noted that he will be enrolling in Medicare this summer and he needs to make sure that he has a plan that will pay for his dupilumab.  I have asked him to contact our biological coordinator regarding information about this issue.  Allena Katz, MD Allergy / Immunology [place]

## 2019-09-18 ENCOUNTER — Encounter: Payer: Self-pay | Admitting: Allergy and Immunology

## 2019-09-18 ENCOUNTER — Telehealth: Payer: Self-pay | Admitting: *Deleted

## 2019-09-18 NOTE — Telephone Encounter (Signed)
L/M for patient to call me to discuss change in Ins effective July

## 2019-09-18 NOTE — Telephone Encounter (Signed)
-----   Message from Jessica Priest, MD sent at 09/17/2019  9:16 AM EDT ----- He is looking at plans for Medicare which start this July that will cover his dupi. Can you call him?

## 2019-10-03 NOTE — Telephone Encounter (Signed)
L/M for patient to contact me again 

## 2019-10-24 ENCOUNTER — Telehealth: Payer: Self-pay | Admitting: *Deleted

## 2019-10-24 NOTE — Telephone Encounter (Signed)
Patient reached out to discuss change over to [facility] next month and his Dupixent therapy.  I advised patient that since copay with [employer] unaffordable for most the best option is patient assistance program though Dupixent [employer]. Will mail app to patient to return with income and Ins card to submit for patient assistance

## 2019-12-23 ENCOUNTER — Encounter: Payer: Self-pay | Admitting: Orthopedic Surgery

## 2019-12-23 ENCOUNTER — Ambulatory Visit: Payer: Self-pay

## 2019-12-23 ENCOUNTER — Ambulatory Visit: Payer: Medicare Other | Admitting: Orthopedic Surgery

## 2019-12-23 ENCOUNTER — Ambulatory Visit (INDEPENDENT_AMBULATORY_CARE_PROVIDER_SITE_OTHER): Payer: Medicare Other

## 2019-12-23 VITALS — Ht 70.0 in | Wt 180.0 lb

## 2019-12-23 DIAGNOSIS — M25571 Pain in right ankle and joints of right foot: Secondary | ICD-10-CM | POA: Diagnosis not present

## 2019-12-23 DIAGNOSIS — M79671 Pain in right foot: Secondary | ICD-10-CM | POA: Diagnosis not present

## 2019-12-23 DIAGNOSIS — M6701 Short Achilles tendon (acquired), right ankle: Secondary | ICD-10-CM | POA: Diagnosis not present

## 2019-12-23 DIAGNOSIS — S92011A Displaced fracture of body of right calcaneus, initial encounter for closed fracture: Secondary | ICD-10-CM

## 2019-12-23 NOTE — Progress Notes (Signed)
Office Visit Note   Patient: Douglas Carroll           Date of Birth: [birth year]           MRN: [id number] Visit Date: 12/23/2019              Requested by: Lonie Peak, PA-C [address] [place],  [place] [address] PCP: Lonie Peak, PA-C  Chief Complaint  Patient presents with  . Right Ankle - Pain  . Right Foot - Pain      HPI: Patient is a 64 year old gentleman who was seen for right foot and ankle pain.  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 Victorino Dike in August 2019.  Patient states he still has numbness across the forefoot.  Assessment & Plan: Visit Diagnoses:  1. Pain in right ankle and joints of right foot   2. Pain in right foot   3. Closed displaced fracture of body of right calcaneus, initial encounter   4. 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  Discussed that most of his symptoms are most likely due to recurrent pressure in the tarsal tunnel from the traumatic arthritis of the subtalar joint.  Discussed that if his symptoms become severe enough would recommend proceeding with subtalar fusion would proceed with surgery through the sinus Tarsi discussed that the cystic changes of the talus may cause increased pressure on this area and may have increased ankle symptoms.  Discussed that due to the traumatic fracture patient would have an increased risk of nonunion of the subtalar fusion.  Follow-Up Instructions: No follow-ups on file.   Ortho Exam  Patient is alert, oriented, no adenopathy, well-dressed, normal affect, normal respiratory effort. Examination patient has good hair growth good pulses.  He has dorsiflexion only to neutral with Achilles contracture.  He does  have pain with subtalar motion with only about 20 degrees of subtalar motion and has pain to palpation over the sinus Tarsi.  He has pain to palpation also over the medial tibiotalar joint line and numbness across the forefoot.  His MRI scan was reviewed which showed the cystic mass in the tarsal tunnel secondary to the traumatic subtalar arthritis from the remote calcaneus fracture.  Imaging: XR Ankle Complete Right  Result Date: 12/23/2019 2 view radiographs of the right ankle shows a congruent mortise there is some decreased bone mineral density of the medial talar dome but no collapse.  There is collapse of the subtalar joint secondary to an old calcaneus fracture.  XR Foot Complete Right  Result Date: 12/23/2019 Three-view radiographs of the right foot shows a flattened Boehler's angle with bone-on-bone contact of the subtalar joint no bony abnormalities throughout the midfoot and forefoot.  No images are attached to the encounter.  Labs: No results found for: HGBA1C, ESRSEDRATE, CRP, LABURIC, REPTSTATUS, GRAMSTAIN, CULT, LABORGA   Lab Results  Component Value Date   ALBUMIN 4.4 09/26/2010    No results found for: MG No results found for: VD25OH  No results found for: PREALBUMIN CBC EXTENDED Latest Ref Rng & Units 09/26/2010  WBC 4.0 - 10.5 K/uL 11.3(H)  RBC 4.22 - 5.81 MIL/uL 5.14  HGB 13.0 - 17.0 g/dL 74.2  HCT 39 - 52 % 59.5  PLT  150 - 400 K/uL 262  NEUTROABS 1.7 - 7.7 K/uL 9.7(H)  LYMPHSABS 0.7 - 4.0 K/uL 1.0     Body mass index is 25.83 kg/m.  Orders:  Orders Placed This Encounter  Procedures  . XR Ankle Complete Right  . XR Foot Complete Right   No orders of the defined types were placed in this encounter.    Procedures: No procedures performed  Clinical Data: No additional findings.  ROS:  All other systems negative, except as noted in the HPI. Review of Systems  Objective: Vital Signs: Ht 5\' 10"  (1.778 m)   Wt 180 lb (81.6 kg)   BMI 25.83  kg/m   Specialty Comments:  No specialty comments available.  PMFS History: Patient Active Problem List   Diagnosis Date Noted  . Allergic rhinitis 01/08/2015  . GERD (gastroesophageal reflux disease) 01/08/2015  . Atopic dermatitis 01/08/2015  . Allergic rhinoconjunctivitis 01/08/2015  . Allergic urticaria 01/08/2015  . Moderate persistent asthma 01/08/2015  . Atopic eczema 01/08/2015   Past Medical History:  Diagnosis Date  . Allergic rhinitis   . Asthma   . Depression   . GERD (gastroesophageal reflux disease)   . Lumbar herniated disc   . Urticaria     History reviewed. No pertinent family history.  Past Surgical History:  Procedure Laterality Date  . ANKLE SURGERY    . CHOLECYSTECTOMY    . SHOULDER OPEN ROTATOR CUFF REPAIR  07/31/2017   Social History   Occupational History  . Not on file  Tobacco Use  . Smoking status: Former Smoker    Types: Cigarettes    Quit date: 04/03/1995    Years since quitting: 24.7  . Smokeless tobacco: Never Used  Vaping Use  . Vaping Use: Never used  Substance and Sexual Activity  . Alcohol use: Yes    Alcohol/week: 12.0 standard drinks    Types: 12 Cans of beer per week  . Drug use: Never  . Sexual activity: Not on file

## 2020-01-27 ENCOUNTER — Telehealth: Payer: Self-pay | Admitting: Allergy and Immunology

## 2020-01-27 NOTE — Telephone Encounter (Signed)
Douglas Carroll states he is supposed to restart Dupixent this week and wants to know if he is supposed to get 2 doses or one dose.

## 2020-01-28 NOTE — Telephone Encounter (Signed)
L/m for patient should be getting one box two syringes (doses)

## 2020-09-15 ENCOUNTER — Other Ambulatory Visit: Payer: Self-pay

## 2020-09-15 ENCOUNTER — Ambulatory Visit: Payer: Medicare Other | Admitting: Allergy and Immunology

## 2020-09-15 ENCOUNTER — Encounter: Payer: Self-pay | Admitting: Allergy and Immunology

## 2020-09-15 VITALS — BP 130/84 | HR 64 | Temp 98.4°F | Resp 16 | Ht 68.5 in | Wt 175.2 lb

## 2020-09-15 DIAGNOSIS — L2089 Other atopic dermatitis: Secondary | ICD-10-CM | POA: Diagnosis not present

## 2020-09-15 DIAGNOSIS — Z91013 Allergy to seafood: Secondary | ICD-10-CM | POA: Diagnosis not present

## 2020-09-15 DIAGNOSIS — J4540 Moderate persistent asthma, uncomplicated: Secondary | ICD-10-CM

## 2020-09-15 DIAGNOSIS — K219 Gastro-esophageal reflux disease without esophagitis: Secondary | ICD-10-CM

## 2020-09-15 DIAGNOSIS — J3089 Other allergic rhinitis: Secondary | ICD-10-CM | POA: Diagnosis not present

## 2020-09-15 NOTE — Patient Instructions (Addendum)
    1.  Continue dupilumab injection every 2 weeks.     2.  Continue to Treat reflux:   A. omeprazole 40 mg in AM  3. If needed:   A. Zyrtec 10 mg one tablet one time per day  B. Pataday - 1 drop each eye 1 time per day  C. Epi-pen  D. Albuterol HFA  4. Return to clinic 12 months or earlier if problem

## 2020-09-15 NOTE — Progress Notes (Signed)
[place] [facility] [place] - [place]   Follow-up Note  Referring Provider: Lonie Peak, PA-C Primary Provider: Lonie Peak, PA-C Date of Office Visit: 09/15/2020  Subjective:   Douglas Carroll (DOB: 1955-12-11) is a 65 y.o. male who [facility] in re-evaluation of the following:  HPI: Douglas Carroll returns to this clinic in evaluation of multiorgan atopic disease including asthma, allergic rhinoconjunctivitis, severe atopic dermatitis, history of chronic urticaria, history of possible shrimp allergy, and also a history of LPR.  His last visit to this clinic was 17 Sep 2019.  He has had another excellent year regarding his multiorgan atopic disease without any difficulty involving either his upper airway, lower airway, or skin while he continues on dupilumab on a regular basis.  He does not require any other additional controller agent.  He does not use a short acting bronchodilator.  He has not had to activate a "action plan" in years.  Likewise his reflux is under excellent control with the use of omeprazole.  He does not eat shrimp in a restaurant and continues to carry an EpiPen when he goes out to eat.  Apparently he can eat fresh shrimp with no problem.  He has received 2 Pfizer COVID vaccines and will not be receiving any additional vaccinations.  Allergies as of 09/15/2020   No Known Allergies     Medication List      albuterol 108 (90 Base) MCG/ACT inhaler Commonly known as: Ventolin HFA Inhale 2 puffs into the lungs every 4 (four) hours as needed for wheezing or shortness of breath.   atorvastatin 80 MG tablet Commonly known as: LIPITOR Take 80 mg by mouth daily.   Dupixent 300 MG/2ML prefilled syringe Generic drug: dupilumab Inject 1 pen/syringe subcutaneously every other week   ibuprofen 800 MG tablet Commonly known as: ADVIL Take 1 tablet (800 mg total) by mouth 3 (three) times daily.    montelukast 10 MG tablet Commonly known as: SINGULAIR Take 1 tablet (10 mg total) by mouth at bedtime.   olopatadine 0.1 % ophthalmic solution Commonly known as: PATANOL Place 1 drop into both eyes 2 (two) times daily.   omeprazole 40 MG capsule Commonly known as: PRILOSEC Take 1 capsule (40 mg total) by mouth every morning.   pravastatin 80 MG tablet Commonly known as: PRAVACHOL Take 80 mg by mouth daily.   sertraline 50 MG tablet Commonly known as: ZOLOFT       Past Medical History:  Diagnosis Date  . Allergic rhinitis   . Asthma   . Depression   . GERD (gastroesophageal reflux disease)   . Lumbar herniated disc   . Urticaria     Past Surgical History:  Procedure Laterality Date  . ANKLE SURGERY    . CHOLECYSTECTOMY    . SHOULDER OPEN ROTATOR CUFF REPAIR  07/31/2017    Review of systems negative except as noted in HPI / PMHx or noted below:  Review of Systems  Constitutional: Negative.   HENT: Negative.   Eyes: Negative.   Respiratory: Negative.   Cardiovascular: Negative.   Gastrointestinal: Negative.   Genitourinary: Negative.   Musculoskeletal: Negative.   Skin: Negative.   Neurological: Negative.   Endo/Heme/Allergies: Negative.   Psychiatric/Behavioral: Negative.      Objective:   Vitals:   09/15/20 0853  BP: 130/84  Pulse: 64  Resp: 16  Temp: 98.4 F (36.9 C)   Height: 5' 8.5" (174 cm)  Weight:  175 lb 3.2 oz (79.5 kg)   Physical Exam Constitutional:      Appearance: He is not diaphoretic.  HENT:     Head: Normocephalic.     Right Ear: Tympanic membrane, ear canal and external ear normal.     Left Ear: Tympanic membrane, ear canal and external ear normal.     Nose: Nose normal. No mucosal edema or rhinorrhea.     Mouth/Throat:     Pharynx: Uvula midline. No oropharyngeal exudate.  Eyes:     Conjunctiva/sclera: Conjunctivae normal.  Neck:     Thyroid: No thyromegaly.     Trachea: Trachea normal. No tracheal tenderness or  tracheal deviation.  Cardiovascular:     Rate and Rhythm: Normal rate and regular rhythm.     Heart sounds: Normal heart sounds, S1 normal and S2 normal. No murmur heard.   Pulmonary:     Effort: No respiratory distress.     Breath sounds: Normal breath sounds. No stridor. No wheezing or rales.  Lymphadenopathy:     Head:     Right side of head: No tonsillar adenopathy.     Left side of head: No tonsillar adenopathy.     Cervical: No cervical adenopathy.  Skin:    Findings: No erythema or rash.     Nails: There is no clubbing.  Neurological:     Mental Status: He is alert.     Diagnostics:    Spirometry was performed and demonstrated an FEV1 of 2.94 at 89 % of predicted.  The patient had an Asthma Control Test with the following results: ACT Total Score: 24.    Assessment and Plan:   1. Asthma, moderate persistent, well-controlled   2. Other allergic rhinitis   3. Other atopic dermatitis   4. Shrimp allergy   5. LPRD (laryngopharyngeal reflux disease)      1.  Continue dupilumab injection every 2 weeks.     2.  Continue to Treat reflux:   A. omeprazole 40 mg in AM  3. If needed:   A. Zyrtec 10 mg one tablet one time per day  B. Pataday - 1 drop each eye 1 time per day  C. Epi-pen  D. Albuterol HFA  4. Return to clinic 12 months or earlier if problem    Douglas Carroll appears to be doing quite well and we will continue to have him use dupilumab to treat his multiorgan atopic disease and omeprazole to treat his reflux.  Assuming he does well using the plan noted above we will see him back in this clinic in 1 year or earlier if there is a problem.  Laurette Schimke, MD Allergy / Immunology [facility]

## 2020-09-16 ENCOUNTER — Encounter: Payer: Self-pay | Admitting: Allergy and Immunology

## 2020-09-30 DIAGNOSIS — E7800 Pure hypercholesterolemia, unspecified: Secondary | ICD-10-CM | POA: Diagnosis not present

## 2020-09-30 DIAGNOSIS — Z1159 Encounter for screening for other viral diseases: Secondary | ICD-10-CM | POA: Diagnosis not present

## 2020-09-30 DIAGNOSIS — K219 Gastro-esophageal reflux disease without esophagitis: Secondary | ICD-10-CM | POA: Diagnosis not present

## 2020-09-30 DIAGNOSIS — F418 Other specified anxiety disorders: Secondary | ICD-10-CM | POA: Diagnosis not present

## 2020-09-30 DIAGNOSIS — Z125 Encounter for screening for malignant neoplasm of prostate: Secondary | ICD-10-CM | POA: Diagnosis not present

## 2020-09-30 DIAGNOSIS — Z0000 Encounter for general adult medical examination without abnormal findings: Secondary | ICD-10-CM | POA: Diagnosis not present

## 2020-12-22 DIAGNOSIS — M542 Cervicalgia: Secondary | ICD-10-CM | POA: Diagnosis not present

## 2021-02-01 DIAGNOSIS — M542 Cervicalgia: Secondary | ICD-10-CM | POA: Diagnosis not present

## 2021-04-05 DIAGNOSIS — F418 Other specified anxiety disorders: Secondary | ICD-10-CM | POA: Diagnosis not present

## 2021-04-05 DIAGNOSIS — K219 Gastro-esophageal reflux disease without esophagitis: Secondary | ICD-10-CM | POA: Diagnosis not present

## 2021-04-05 DIAGNOSIS — T7802XA Anaphylactic reaction due to shellfish (crustaceans), initial encounter: Secondary | ICD-10-CM | POA: Diagnosis not present

## 2021-04-05 DIAGNOSIS — Z79899 Other long term (current) drug therapy: Secondary | ICD-10-CM | POA: Diagnosis not present

## 2021-04-05 DIAGNOSIS — E7800 Pure hypercholesterolemia, unspecified: Secondary | ICD-10-CM | POA: Diagnosis not present

## 2021-05-04 DIAGNOSIS — G5603 Carpal tunnel syndrome, bilateral upper limbs: Secondary | ICD-10-CM | POA: Diagnosis not present

## 2021-05-11 DIAGNOSIS — H8111 Benign paroxysmal vertigo, right ear: Secondary | ICD-10-CM | POA: Diagnosis not present

## 2021-05-14 DIAGNOSIS — R972 Elevated prostate specific antigen [PSA]: Secondary | ICD-10-CM | POA: Diagnosis not present

## 2021-05-18 DIAGNOSIS — Z6826 Body mass index (BMI) 26.0-26.9, adult: Secondary | ICD-10-CM | POA: Diagnosis not present

## 2021-05-18 DIAGNOSIS — H8111 Benign paroxysmal vertigo, right ear: Secondary | ICD-10-CM | POA: Diagnosis not present

## 2021-05-19 DIAGNOSIS — G5603 Carpal tunnel syndrome, bilateral upper limbs: Secondary | ICD-10-CM | POA: Diagnosis not present

## 2021-05-21 DIAGNOSIS — R972 Elevated prostate specific antigen [PSA]: Secondary | ICD-10-CM | POA: Diagnosis not present

## 2021-05-21 DIAGNOSIS — N401 Enlarged prostate with lower urinary tract symptoms: Secondary | ICD-10-CM | POA: Diagnosis not present

## 2021-05-21 DIAGNOSIS — R350 Frequency of micturition: Secondary | ICD-10-CM | POA: Diagnosis not present

## 2021-05-21 DIAGNOSIS — R3912 Poor urinary stream: Secondary | ICD-10-CM | POA: Diagnosis not present

## 2021-05-25 DIAGNOSIS — G5603 Carpal tunnel syndrome, bilateral upper limbs: Secondary | ICD-10-CM | POA: Diagnosis not present

## 2021-05-26 ENCOUNTER — Other Ambulatory Visit: Payer: Self-pay

## 2021-05-26 ENCOUNTER — Encounter (HOSPITAL_BASED_OUTPATIENT_CLINIC_OR_DEPARTMENT_OTHER): Payer: Self-pay | Admitting: Orthopedic Surgery

## 2021-05-26 NOTE — H&P (Signed)
Preoperative History & Physical Exam  Surgeon: Philipp Ovens, MD  Diagnosis: Right Carpal Tunnel Syndrome  Planned Procedure: Procedure(s) (LRB): CARPAL TUNNEL RELEASE (Right)  History of Present Illness:   Patient is a 66 y.o. male with symptoms consistent with Right Carpal Tunnel Syndrome who presents for surgical intervention. The risks, benefits and alternatives of surgical intervention were discussed and informed consent was obtained prior to surgery.  Past Medical History:  Past Medical History:  Diagnosis Date   Arthritis    Asthma    Depression    Eczema    GERD (gastroesophageal reflux disease)    Right carpal tunnel syndrome     Past Surgical History: History reviewed. No pertinent surgical history.  Medications:  Prior to Admission medications   Medication Sig Start Date End Date Taking? Authorizing Provider  atorvastatin (LIPITOR) 80 MG tablet Take 80 mg by mouth daily.   Yes [provider]  Dupilumab (DUPIXENT [place]) Inject into the skin. Q 2 weeks for eczema and asthma   Yes [provider]  sertraline (ZOLOFT) 50 MG tablet Take 50 mg by mouth daily.   Yes [provider]    Allergies:  Patient has no known allergies.  Review of Systems: Negative except per HPI.  Physical Exam: Alert and oriented, NAD Head and neck: no masses, normal alignment CV: pulse intact Pulm: no increased work of breathing, respirations even and unlabored Abdomen: non-distended Extremities: extremities warm and well perfused  LABS: No results found for this or any previous visit (from the past 2160 hour(s)).   Complete History and Physical exam available in the office notes  [name]

## 2021-05-26 NOTE — Progress Notes (Signed)
Spoke w/ via phone for pre-op interview---pt Lab needs dos----  none per anesthesia surgery orders pending             Lab results------none COVID test -----patient states asymptomatic no test needed Arrive at -------1330 pm 05-31-2021 NPO after MN NO Solid Food.  Clear liquids from MN until---1230 pm Med rec completed Medications to take morning of surgery -----Omeprazole, Sertraline, Atorvastatin,  Diabetic medication -----n/a Patient instructed no nail polish to be worn day of surgery Patient instructed to bring photo id and insurance card day of surgery Patient aware to have Driver (ride ) / caregiver    for 24 hours after surgery Malachi Bonds goodman friend Patient Special Instructions -----none Pre-Op special Istructions -----none Patient verbalized understanding of instructions that were given at this phone interview. Patient denies shortness of breath, chest pain, fever, cough at this phone interview.

## 2021-05-27 ENCOUNTER — Encounter: Payer: Self-pay | Admitting: Allergy and Immunology

## 2021-05-31 ENCOUNTER — Ambulatory Visit (HOSPITAL_BASED_OUTPATIENT_CLINIC_OR_DEPARTMENT_OTHER): Payer: Medicare Other | Admitting: Anesthesiology

## 2021-05-31 ENCOUNTER — Encounter (HOSPITAL_BASED_OUTPATIENT_CLINIC_OR_DEPARTMENT_OTHER): Payer: Self-pay | Admitting: Orthopedic Surgery

## 2021-05-31 ENCOUNTER — Ambulatory Visit (HOSPITAL_BASED_OUTPATIENT_CLINIC_OR_DEPARTMENT_OTHER)
Admission: RE | Admit: 2021-05-31 | Discharge: 2021-05-31 | Disposition: A | Discharge status: Home / Self Care | Payer: Medicare Other | Attending: Orthopedic Surgery | Admitting: Orthopedic Surgery

## 2021-05-31 ENCOUNTER — Encounter (HOSPITAL_BASED_OUTPATIENT_CLINIC_OR_DEPARTMENT_OTHER)
Admission: RE | Disposition: A | Discharge status: Home / Self Care | Payer: Self-pay | Source: Home / Self Care | Attending: Orthopedic Surgery

## 2021-05-31 ENCOUNTER — Other Ambulatory Visit: Payer: Self-pay

## 2021-05-31 DIAGNOSIS — J45909 Unspecified asthma, uncomplicated: Secondary | ICD-10-CM | POA: Diagnosis not present

## 2021-05-31 DIAGNOSIS — M1990 Unspecified osteoarthritis, unspecified site: Secondary | ICD-10-CM | POA: Diagnosis not present

## 2021-05-31 DIAGNOSIS — K219 Gastro-esophageal reflux disease without esophagitis: Secondary | ICD-10-CM | POA: Diagnosis not present

## 2021-05-31 DIAGNOSIS — G5601 Carpal tunnel syndrome, right upper limb: Secondary | ICD-10-CM | POA: Insufficient documentation

## 2021-05-31 DIAGNOSIS — Z87891 Personal history of nicotine dependence: Secondary | ICD-10-CM | POA: Diagnosis not present

## 2021-05-31 HISTORY — DX: Dermatitis, unspecified: L30.9

## 2021-05-31 HISTORY — DX: Depression, unspecified: F32.A

## 2021-05-31 HISTORY — PX: CARPAL TUNNEL RELEASE: SHX101

## 2021-05-31 HISTORY — DX: Carpal tunnel syndrome, right upper limb: G56.01

## 2021-05-31 HISTORY — DX: Unspecified osteoarthritis, unspecified site: M19.90

## 2021-05-31 HISTORY — DX: Gastro-esophageal reflux disease without esophagitis: K21.9

## 2021-05-31 HISTORY — DX: Unspecified asthma, uncomplicated: J45.909

## 2021-05-31 SURGERY — CARPAL TUNNEL RELEASE
Anesthesia: Monitor Anesthesia Care | Laterality: Right

## 2021-05-31 MED ORDER — FENTANYL CITRATE (PF) 100 MCG/2ML IJ SOLN
INTRAMUSCULAR | Status: AC
  Filled 2021-05-31: qty 2

## 2021-05-31 MED ORDER — BACITRACIN ZINC 500 UNIT/GM EX OINT
TOPICAL_OINTMENT | CUTANEOUS | Status: DC | PRN
Start: 2021-05-31 — End: 2021-05-31
  Administered 2021-05-31: 1 via TOPICAL

## 2021-05-31 MED ORDER — HYDROCODONE-ACETAMINOPHEN 5-325 MG PO TABS
1.0000 | ORAL_TABLET | Freq: Four times a day (QID) | ORAL | 0 refills | Status: AC | PRN
Start: 2021-05-31 — End: 2021-06-03

## 2021-05-31 MED ORDER — PROPOFOL 500 MG/50ML IV EMUL
INTRAVENOUS | Status: DC | PRN
Start: 2021-05-31 — End: 2021-05-31
  Administered 2021-05-31: 50 ug/kg/min via INTRAVENOUS

## 2021-05-31 MED ORDER — ONDANSETRON HCL 4 MG/2ML IJ SOLN
INTRAMUSCULAR | Status: DC | PRN
  Administered 2021-05-31: 4 mg via INTRAVENOUS

## 2021-05-31 MED ORDER — MIDAZOLAM HCL 5 MG/5ML IJ SOLN
INTRAMUSCULAR | Status: DC | PRN
  Administered 2021-05-31: 2 mg via INTRAVENOUS

## 2021-05-31 MED ORDER — PROPOFOL 500 MG/50ML IV EMUL
INTRAVENOUS | Status: AC
  Filled 2021-05-31: qty 50

## 2021-05-31 MED ORDER — PROPOFOL 10 MG/ML IV BOLUS
INTRAVENOUS | Status: DC | PRN
  Administered 2021-05-31: 40 mg via INTRAVENOUS

## 2021-05-31 MED ORDER — BUPIVACAINE HCL (PF) 0.5 % IJ SOLN
INTRAMUSCULAR | Status: DC | PRN
Start: 2021-05-31 — End: 2021-05-31
  Administered 2021-05-31: 5 mL

## 2021-05-31 MED ORDER — PROPOFOL 10 MG/ML IV BOLUS
INTRAVENOUS | Status: AC
  Filled 2021-05-31: qty 20

## 2021-05-31 MED ORDER — LIDOCAINE HCL (PF) 1 % IJ SOLN
INTRAMUSCULAR | Status: DC | PRN
  Administered 2021-05-31: 5 mL

## 2021-05-31 MED ORDER — 0.9 % SODIUM CHLORIDE (POUR BTL) OPTIME
TOPICAL | Status: DC | PRN
  Administered 2021-05-31: 500 mL

## 2021-05-31 MED ORDER — FENTANYL CITRATE (PF) 100 MCG/2ML IJ SOLN
INTRAMUSCULAR | Status: DC | PRN
  Administered 2021-05-31: 50 ug via INTRAVENOUS

## 2021-05-31 MED ORDER — MIDAZOLAM HCL 2 MG/2ML IJ SOLN
INTRAMUSCULAR | Status: AC
  Filled 2021-05-31: qty 2

## 2021-05-31 MED ORDER — LACTATED RINGERS IV SOLN: INTRAVENOUS | Status: DC

## 2021-05-31 SURGICAL SUPPLY — 30 items
BLADE SURG 15 STRL LF DISP TIS (BLADE) ×1 IMPLANT
BLADE SURG 15 STRL SS (BLADE) ×4
BNDG CMPR 9X4 STRL LF SNTH (GAUZE/BANDAGES/DRESSINGS) ×1
BNDG ELASTIC 4X5.8 VLCR STR LF (GAUZE/BANDAGES/DRESSINGS) ×2 IMPLANT
BNDG ESMARK 4X9 LF (GAUZE/BANDAGES/DRESSINGS) ×2 IMPLANT
COVER BACK TABLE 60X90IN (DRAPES) ×2 IMPLANT
CUFF TOURN SGL QUICK 18 (TOURNIQUET CUFF) ×2 IMPLANT
DRAPE EXTREMITY T 121X128X90 (DISPOSABLE) ×2 IMPLANT
DRSG EMULSION OIL 3X3 NADH (GAUZE/BANDAGES/DRESSINGS) ×2 IMPLANT
GAUZE 4X4 16PLY ~~LOC~~+RFID DBL (SPONGE) ×2 IMPLANT
GAUZE SPONGE 4X4 12PLY STRL (GAUZE/BANDAGES/DRESSINGS) ×2 IMPLANT
GLOVE SURG UNDER POLY LF SZ7.5 (GLOVE) ×2 IMPLANT
GOWN STRL REUS W/ TWL LRG LVL3 (GOWN DISPOSABLE) ×1 IMPLANT
GOWN STRL REUS W/TWL LRG LVL3 (GOWN DISPOSABLE) ×2
HIBICLENS CHG 4% 4OZ BTL (MISCELLANEOUS) ×2 IMPLANT
KIT TURNOVER CYSTO (KITS) ×2 IMPLANT
KNIFE CARPAL TUNNEL (BLADE) ×2 IMPLANT
NEEDLE HYPO 22GX1.5 SAFETY (NEEDLE) ×2 IMPLANT
NS IRRIG 500ML POUR BTL (IV SOLUTION) ×2 IMPLANT
PACK BASIN DAY SURGERY FS (CUSTOM PROCEDURE TRAY) ×2 IMPLANT
PAD CAST 4YDX4 CTTN HI CHSV (CAST SUPPLIES) ×1 IMPLANT
PADDING CAST ABS 4INX4YD NS (CAST SUPPLIES) ×1
PADDING CAST ABS COTTON 4X4 ST (CAST SUPPLIES) IMPLANT
PADDING CAST COTTON 4X4 STRL (CAST SUPPLIES) ×2
SUT ETHILON 4 0 PS 2 18 (SUTURE) ×2 IMPLANT
SYR 10ML LL (SYRINGE) ×2 IMPLANT
SYR BULB EAR ULCER 3OZ GRN STR (SYRINGE) ×2 IMPLANT
TOWEL OR 17X26 10 PK STRL BLUE (TOWEL DISPOSABLE) ×2 IMPLANT
TRAY DSU PREP LF (CUSTOM PROCEDURE TRAY) ×2 IMPLANT
UNDERPAD 30X36 HEAVY ABSORB (UNDERPADS AND DIAPERS) ×2 IMPLANT

## 2021-05-31 NOTE — Anesthesia Preprocedure Evaluation (Signed)
Anesthesia Evaluation  Patient identified by MRN, date of birth, ID band Patient awake    Reviewed: Allergy & Precautions, NPO status , Patient's Chart, lab work & pertinent test results  Airway Mallampati: II  TM Distance: >3 FB Neck ROM: Full    Dental  (+) Dental Advisory Given, Teeth Intact   Pulmonary asthma , former smoker,    Pulmonary exam normal breath sounds clear to auscultation       Cardiovascular negative cardio ROS Normal cardiovascular exam Rhythm:Regular Rate:Normal     Neuro/Psych PSYCHIATRIC DISORDERS Depression negative neurological ROS     GI/Hepatic Neg liver ROS, GERD  ,  Endo/Other  negative endocrine ROS  Renal/GU negative Renal ROS     Musculoskeletal  (+) Arthritis ,   Abdominal   Peds  Hematology negative hematology ROS (+)   Anesthesia Other Findings   Reproductive/Obstetrics                            Anesthesia Physical Anesthesia Plan  ASA: 2  Anesthesia Plan: MAC   Post-op Pain Management: Minimal or no pain anticipated and Tylenol PO (pre-op)   Induction: Intravenous  PONV Risk Score and Plan: 1 and Propofol infusion, Treatment may vary due to age or medical condition and Midazolam  Airway Management Planned: Natural Airway  Additional Equipment: None  Intra-op Plan:   Post-operative Plan: Extubation in OR  Informed Consent: I have reviewed the patients History and Physical, chart, labs and discussed the procedure including the risks, benefits and alternatives for the proposed anesthesia with the patient or authorized representative who has indicated his/her understanding and acceptance.     Dental advisory given  Plan Discussed with: CRNA  Anesthesia Plan Comments:        Anesthesia Quick Evaluation

## 2021-05-31 NOTE — Interval H&P Note (Signed)
History and Physical Interval Note:  05/31/2021 12:47 PM  Douglas Carroll  has presented today for surgery, with the diagnosis of Right Carpal Tunnel Syndrome.  The various methods of treatment have been discussed with the patient and family. After consideration of risks, benefits and other options for treatment, the patient has consented to  Procedure(s) with comments: CARPAL TUNNEL RELEASE (Right) - with local as a surgical intervention.  The patient's history has been reviewed, patient examined, no change in status, stable for surgery.  I have reviewed the patient's chart and labs.  Questions were answered to the patient's satisfaction.     [name]

## 2021-05-31 NOTE — Op Note (Signed)
OPERATIVE NOTE  DATE OF PROCEDURE: 05/31/2021  SURGEON: Izell Burr [place], MD  PREOPERATIVE DIAGNOSIS: Right Carpal Tunnel Syndrome  POSTOPERATIVE DIAGNOSIS: Same  NAME OF PROCEDURE: Right Carpal Tunnel Release  ANESTHESIA: Local + MAC  SKIN PREPARATION: Hibiclens  ESTIMATED BLOOD LOSS: Minimal  IMPLANTS: none  INDICATIONS:  Douglas Carroll is a 66 y.o. male who presents with right carpal tunnel syndrome, refractory to nonoperative treatment. The patient has decided to proceed with surgical intervention.  Risks, benefits and alternatives of operative management were discussed including, but not limited to, risks of anesthesia complications, infection, pain, persistent symptoms, stiffness, need for future surgery.  The patient understands, agrees and elects to proceed with surgery.    DESCRIPTION OF PROCEDURE: The patient was placed in the usual supine position and the right upper extremity was prepped and draped in normal sterile fashion.  After local block anesthetic to the right hand and wrist, a standard 1.5 cm incision was made in the midpalm.  This was carried down through the subcutaneous tissues and palmar fascia to the transverse carpal ligament.  The distal one-half of the transverse carpal ligament was incised longitudinally under direct vision using a 15 blade.  The carpal tunnel release guide was then placed under direct vision on the transverse carpal ligament and slid proximally.  The guide was palpated into appropriate alignment longitudinally.  Contact with the transverse carpal ligament was maintained throughout passing.  The blade was engaged into the guide and the remaining portion of the transverse carpal ligament released completely.  No other abnormalities were noted.  The wound was copiously irrigated and the skin closed using horizontal mattress 4-0 nylon sutures.  A light bulky dressing was placed.  The patient tolerated the procedure well and returned to the recovery room in stable  condition.  I was present for the entire surgical procedure.   Matt Holmes, MD

## 2021-05-31 NOTE — Transfer of Care (Signed)
Immediate Anesthesia Transfer of Care Note  Patient: Douglas Carroll  Procedure(s) Performed: CARPAL TUNNEL RELEASE (Right)  Patient Location: PACU  Anesthesia Type:MAC  Level of Consciousness: awake, alert , oriented and patient cooperative  Airway & Oxygen Therapy: Patient Spontanous Breathing  Post-op Assessment: Report given to RN and Post -op Vital signs reviewed and stable  Post vital signs: Reviewed and stable  Last Vitals:  Vitals Value Taken Time  BP    Temp    Pulse    Resp    SpO2      Last Pain:  Vitals:   05/31/21 1117  TempSrc: Oral         Complications: No notable events documented.

## 2021-05-31 NOTE — Discharge Instructions (Signed)
°  Orthopaedic Hand Surgery Discharge Instructions ° °WEIGHT BEARING STATUS: Non weight bearing on operative extremity ° °INCISION CARE: Keep dressing over your incision clean and dry until 5 days after surgery. You may shower by placing a waterproof covering over your dressing. Once dressing is removed, you may allow water to run over the incision and then place Band-Aids over incision. Do not scrub your incision or apply creams/lotions. Do not submerge your incision or swim for 3 weeks after surgery. Contact your surgeon or primary care doctor if you develop redness or drainage from your incision.  ° °PAIN CONTROL: First line medications for post operative pain control are Tylenol (acetaminophen) and Motrin (ibuprofen) if you are able to take these medications. If you have been prescribed a medication these can be taken as breakthrough pain medications. Please note that some narcotic pain medication has acetaminophen added and you should never consume more than 4,000mg of acetaminophen in 24-hour period. Please note that if you are given Toradol (ketorolac) you should not take similar medications such as ibuprofen or naproxen. ° °DISCHARGE MEDICATIONS: If you have been prescribed medication it was sent electronically to your pharmacy. No changes have been made to your home medications. ° °ICE/ELEVATION: Ice and elevate your injured extremity as needed. Avoid direct contact of ice with skin.  ° °BANDAGE FEELS TOO TIGHT: If your bandage feels too tight, first make sure you are elevating your fingers as much as possible. The outer layer of the bandage can be unwrapped and reapplied more loosely. If no improvement, you may carefully cut the inner layer longitudinally until the pressure has resolved and then rewrap the outer layer. If you are not comfortable with these instructions, please call the office and the bandage can be changed for you.  ° °FOLLOW UP: You will be called after surgery with an appointment date and  time, however if you have not received a phone call within 3 days, please call during regular office hours at [phone] to schedule a post operative appointment. ° °Please Seek Medical Attention if: °Call MD for: pain or pressure in chest, jaw, arm, back, neck  °Call MD for: temperature greater than 101 F for more than 24 hrs °Call MD for: difficulty breathing °Call MD for: incision redness, bleeding, drainage  °Call MD for: palpitations or feeling that the heart is racing  °Call MD for: increased swelling in arm, leg, ankle, or abdomen  °Call MD for: lightheadedness, dizziness, fainting °Call 911 or go to ER for any medical emergency if you are not able to get in touch with your doctor ° ° °J. Reid Verlon Pischke, MD °Orthopaedic Hand Surgeon °[facility] °Office number: [phone] °[address] °[place], [place] [address] ° ° °

## 2021-06-01 ENCOUNTER — Encounter (HOSPITAL_BASED_OUTPATIENT_CLINIC_OR_DEPARTMENT_OTHER): Payer: Self-pay | Admitting: Orthopedic Surgery

## 2021-06-01 NOTE — Anesthesia Postprocedure Evaluation (Signed)
Anesthesia Post Note  Patient: Douglas Carroll  Procedure(s) Performed: CARPAL TUNNEL RELEASE (Right)     Patient location during evaluation: PACU Anesthesia Type: MAC Level of consciousness: awake and alert Pain management: pain level controlled Vital Signs Assessment: post-procedure vital signs reviewed and stable Respiratory status: spontaneous breathing Cardiovascular status: stable Anesthetic complications: no   No notable events documented.  Last Vitals:  Vitals:   05/31/21 1330 05/31/21 1355  BP: 135/89 130/90  Pulse: 65 (!) 59  Resp: 17 16  Temp: 36.5 C 36.6 C  SpO2: 99% 98%    Last Pain:  Vitals:   05/31/21 1355  TempSrc:   PainSc: 0-No pain                 Nolon Nations

## 2021-06-21 DIAGNOSIS — H524 Presbyopia: Secondary | ICD-10-CM | POA: Diagnosis not present

## 2021-06-21 DIAGNOSIS — H5203 Hypermetropia, bilateral: Secondary | ICD-10-CM | POA: Diagnosis not present

## 2021-06-21 DIAGNOSIS — H25813 Combined forms of age-related cataract, bilateral: Secondary | ICD-10-CM | POA: Diagnosis not present

## 2021-06-21 DIAGNOSIS — Z9889 Other specified postprocedural states: Secondary | ICD-10-CM | POA: Diagnosis not present

## 2021-07-08 ENCOUNTER — Encounter (HOSPITAL_BASED_OUTPATIENT_CLINIC_OR_DEPARTMENT_OTHER): Payer: Self-pay

## 2021-07-08 ENCOUNTER — Ambulatory Visit (HOSPITAL_BASED_OUTPATIENT_CLINIC_OR_DEPARTMENT_OTHER): Admit: 2021-07-08 | Payer: Medicare Other | Admitting: Orthopedic Surgery

## 2021-07-08 SURGERY — CARPAL TUNNEL RELEASE
Anesthesia: Monitor Anesthesia Care | Laterality: Left

## 2021-07-09 DIAGNOSIS — G5602 Carpal tunnel syndrome, left upper limb: Secondary | ICD-10-CM | POA: Diagnosis not present

## 2021-09-21 ENCOUNTER — Ambulatory Visit: Payer: Medicare Other | Admitting: Allergy and Immunology

## 2021-10-04 DIAGNOSIS — Z79899 Other long term (current) drug therapy: Secondary | ICD-10-CM | POA: Diagnosis not present

## 2021-10-04 DIAGNOSIS — F418 Other specified anxiety disorders: Secondary | ICD-10-CM | POA: Diagnosis not present

## 2021-10-04 DIAGNOSIS — K219 Gastro-esophageal reflux disease without esophagitis: Secondary | ICD-10-CM | POA: Diagnosis not present

## 2021-10-04 DIAGNOSIS — E7800 Pure hypercholesterolemia, unspecified: Secondary | ICD-10-CM | POA: Diagnosis not present

## 2021-10-11 DIAGNOSIS — K5730 Diverticulosis of large intestine without perforation or abscess without bleeding: Secondary | ICD-10-CM | POA: Diagnosis not present

## 2021-10-11 DIAGNOSIS — K648 Other hemorrhoids: Secondary | ICD-10-CM | POA: Diagnosis not present

## 2021-10-11 DIAGNOSIS — D122 Benign neoplasm of ascending colon: Secondary | ICD-10-CM | POA: Diagnosis not present

## 2021-10-11 DIAGNOSIS — Z86010 Personal history of colonic polyps: Secondary | ICD-10-CM | POA: Diagnosis not present

## 2021-10-11 DIAGNOSIS — Z09 Encounter for follow-up examination after completed treatment for conditions other than malignant neoplasm: Secondary | ICD-10-CM | POA: Diagnosis not present

## 2021-10-13 DIAGNOSIS — D122 Benign neoplasm of ascending colon: Secondary | ICD-10-CM | POA: Diagnosis not present

## 2021-10-28 DIAGNOSIS — E785 Hyperlipidemia, unspecified: Secondary | ICD-10-CM | POA: Diagnosis not present

## 2021-10-28 DIAGNOSIS — Z0000 Encounter for general adult medical examination without abnormal findings: Secondary | ICD-10-CM | POA: Diagnosis not present

## 2021-10-28 DIAGNOSIS — Z1331 Encounter for screening for depression: Secondary | ICD-10-CM | POA: Diagnosis not present

## 2022-01-18 DIAGNOSIS — M25811 Other specified joint disorders, right shoulder: Secondary | ICD-10-CM | POA: Diagnosis not present

## 2022-01-18 DIAGNOSIS — M25511 Pain in right shoulder: Secondary | ICD-10-CM | POA: Diagnosis not present

## 2022-04-07 DIAGNOSIS — E7800 Pure hypercholesterolemia, unspecified: Secondary | ICD-10-CM | POA: Diagnosis not present

## 2022-04-07 DIAGNOSIS — F418 Other specified anxiety disorders: Secondary | ICD-10-CM | POA: Diagnosis not present

## 2022-04-07 DIAGNOSIS — K219 Gastro-esophageal reflux disease without esophagitis: Secondary | ICD-10-CM | POA: Diagnosis not present

## 2022-04-07 DIAGNOSIS — Z79899 Other long term (current) drug therapy: Secondary | ICD-10-CM | POA: Diagnosis not present

## 2022-05-16 DIAGNOSIS — J069 Acute upper respiratory infection, unspecified: Secondary | ICD-10-CM | POA: Diagnosis not present

## 2022-05-17 DIAGNOSIS — R972 Elevated prostate specific antigen [PSA]: Secondary | ICD-10-CM | POA: Diagnosis not present

## 2022-05-24 DIAGNOSIS — R972 Elevated prostate specific antigen [PSA]: Secondary | ICD-10-CM | POA: Diagnosis not present

## 2022-05-24 DIAGNOSIS — R350 Frequency of micturition: Secondary | ICD-10-CM | POA: Diagnosis not present

## 2022-05-24 DIAGNOSIS — N401 Enlarged prostate with lower urinary tract symptoms: Secondary | ICD-10-CM | POA: Diagnosis not present

## 2022-05-24 DIAGNOSIS — R3912 Poor urinary stream: Secondary | ICD-10-CM | POA: Diagnosis not present

## 2022-05-30 ENCOUNTER — Other Ambulatory Visit: Payer: Self-pay | Admitting: Urology

## 2022-05-30 DIAGNOSIS — R972 Elevated prostate specific antigen [PSA]: Secondary | ICD-10-CM

## 2022-07-04 ENCOUNTER — Ambulatory Visit
Admission: RE | Admit: 2022-07-04 | Discharge: 2022-07-04 | Disposition: A | Discharge status: Home / Self Care | Payer: Medicare Other | Source: Ambulatory Visit | Attending: Urology | Admitting: Urology

## 2022-07-04 DIAGNOSIS — R972 Elevated prostate specific antigen [PSA]: Secondary | ICD-10-CM | POA: Diagnosis not present

## 2022-07-04 MED ORDER — GADOPICLENOL 0.5 MMOL/ML IV SOLN
9.0000 mL | Freq: Once | INTRAVENOUS | Status: AC | PRN
  Administered 2022-07-04: 9 mL via INTRAVENOUS

## 2022-08-23 DIAGNOSIS — H25813 Combined forms of age-related cataract, bilateral: Secondary | ICD-10-CM | POA: Diagnosis not present

## 2022-08-23 DIAGNOSIS — H524 Presbyopia: Secondary | ICD-10-CM | POA: Diagnosis not present

## 2022-08-23 DIAGNOSIS — H5203 Hypermetropia, bilateral: Secondary | ICD-10-CM | POA: Diagnosis not present

## 2022-09-29 DIAGNOSIS — K080 Exfoliation of teeth due to systemic causes: Secondary | ICD-10-CM | POA: Diagnosis not present

## 2022-10-07 DIAGNOSIS — E7800 Pure hypercholesterolemia, unspecified: Secondary | ICD-10-CM | POA: Diagnosis not present

## 2022-10-07 DIAGNOSIS — F418 Other specified anxiety disorders: Secondary | ICD-10-CM | POA: Diagnosis not present

## 2022-10-07 DIAGNOSIS — Z79899 Other long term (current) drug therapy: Secondary | ICD-10-CM | POA: Diagnosis not present

## 2022-10-07 DIAGNOSIS — K219 Gastro-esophageal reflux disease without esophagitis: Secondary | ICD-10-CM | POA: Diagnosis not present

## 2022-12-27 DIAGNOSIS — H524 Presbyopia: Secondary | ICD-10-CM | POA: Diagnosis not present

## 2023-01-31 DIAGNOSIS — Z0000 Encounter for general adult medical examination without abnormal findings: Secondary | ICD-10-CM | POA: Diagnosis not present

## 2023-01-31 DIAGNOSIS — Z139 Encounter for screening, unspecified: Secondary | ICD-10-CM | POA: Diagnosis not present

## 2023-01-31 DIAGNOSIS — Z9181 History of falling: Secondary | ICD-10-CM | POA: Diagnosis not present

## 2023-01-31 DIAGNOSIS — Z1331 Encounter for screening for depression: Secondary | ICD-10-CM | POA: Diagnosis not present

## 2023-02-16 DIAGNOSIS — R972 Elevated prostate specific antigen [PSA]: Secondary | ICD-10-CM | POA: Diagnosis not present

## 2023-02-28 DIAGNOSIS — R972 Elevated prostate specific antigen [PSA]: Secondary | ICD-10-CM | POA: Diagnosis not present

## 2023-02-28 DIAGNOSIS — R3912 Poor urinary stream: Secondary | ICD-10-CM | POA: Diagnosis not present

## 2023-02-28 DIAGNOSIS — N401 Enlarged prostate with lower urinary tract symptoms: Secondary | ICD-10-CM | POA: Diagnosis not present

## 2023-03-27 ENCOUNTER — Telehealth: Payer: Self-pay | Admitting: *Deleted

## 2023-03-27 NOTE — Telephone Encounter (Signed)
Called patient back and l/m to contact me

## 2023-04-06 DIAGNOSIS — K080 Exfoliation of teeth due to systemic causes: Secondary | ICD-10-CM | POA: Diagnosis not present

## 2023-04-14 DIAGNOSIS — K219 Gastro-esophageal reflux disease without esophagitis: Secondary | ICD-10-CM | POA: Diagnosis not present

## 2023-04-14 DIAGNOSIS — F418 Other specified anxiety disorders: Secondary | ICD-10-CM | POA: Diagnosis not present

## 2023-04-14 DIAGNOSIS — E7800 Pure hypercholesterolemia, unspecified: Secondary | ICD-10-CM | POA: Diagnosis not present

## 2023-04-25 ENCOUNTER — Ambulatory Visit: Payer: Medicare Other | Admitting: Allergy and Immunology

## 2023-04-25 VITALS — BP 118/84 | HR 80 | Temp 97.9°F | Resp 16 | Ht 68.0 in | Wt 179.9 lb

## 2023-04-25 DIAGNOSIS — J3089 Other allergic rhinitis: Secondary | ICD-10-CM

## 2023-04-25 DIAGNOSIS — K219 Gastro-esophageal reflux disease without esophagitis: Secondary | ICD-10-CM | POA: Diagnosis not present

## 2023-04-25 DIAGNOSIS — L2089 Other atopic dermatitis: Secondary | ICD-10-CM

## 2023-04-25 DIAGNOSIS — J4550 Severe persistent asthma, uncomplicated: Secondary | ICD-10-CM | POA: Diagnosis not present

## 2023-04-25 MED ORDER — EPINEPHRINE 0.3 MG/0.3ML IJ SOAJ: 0.3000 mg | INTRAMUSCULAR | 1 refills | Status: DC | PRN

## 2023-04-25 MED ORDER — LEVOCETIRIZINE DIHYDROCHLORIDE 5 MG PO TABS
5.0000 mg | ORAL_TABLET | Freq: Every day | ORAL | 1 refills | Status: AC | PRN
Start: 2023-04-25 — End: ?

## 2023-04-25 MED ORDER — OMEPRAZOLE 40 MG PO CPDR: 40.0000 mg | DELAYED_RELEASE_CAPSULE | Freq: Every day | ORAL | 3 refills | Status: DC

## 2023-04-25 NOTE — Progress Notes (Signed)
[place] - [place]   Follow-up Note  Referring Provider: No ref. provider found Primary Provider: Pcp, No Date of Office Visit: 04/25/2023  Subjective:   Douglas Carroll (DOB: 1956-01-11) is a 67 y.o. male who [facility]23 in re-evaluation of the following:  HPI: Douglas Carroll returns to this clinic in evaluation of multiorgan atopic disease including asthma, allergic rhinoconjunctivitis, severe atopic dermatitis, history of chronic urticaria, and history of LPR.  I last saw him in this clinic 15 Sep 2020.  He continues on dupilumab injections and has done wonderful while utilizing anti-IL-4/13 biologic agent without any significant issues involving either his airway or his skin.  He no longer needs to use any type of short acting bronchodilator and has not done so for years and can exercise without any problem and his skin does not require any topical agents and his upper airway occasionally requires some over-the-counter antihistamine.  He has not required a systemic steroid or antibiotic for any type of skin or respiratory tract issue.  His reflux is under excellent control while using omeprazole.  He had a history of shrimp allergy but he can now eat fresh shrimp with no problem at all.  He still would like an EpiPen.  He does not receive the flu vaccine.  Since I have seen him in this clinic he has had bilateral carpal tunnel surgery successfully.  Allergies as of 04/25/2023       Reactions   Mixed Ragweed Rash   Cat Hair Extract Itching        Medication List    atorvastatin 80 MG tablet Commonly known as: LIPITOR Take 80 mg by mouth daily.   DUPIXENT [place] Inject into the skin. Q 2 weeks for eczema and asthma   omeprazole 40 MG capsule Commonly known as: PRILOSEC Take 1 capsule (40 mg total) by mouth every morning.   sertraline 50 MG tablet Commonly known as: ZOLOFT Take 50 mg by mouth  daily.    Past Medical History:  Diagnosis Date   Allergic rhinitis    Arthritis    Asthma    Depression    Eczema    GERD (gastroesophageal reflux disease)    Lumbar herniated disc    Right carpal tunnel syndrome    Urticaria     Past Surgical History:  Procedure Laterality Date   ANKLE SURGERY     CARPAL TUNNEL RELEASE Right 05/31/2021   Procedure: CARPAL TUNNEL RELEASE;  Surgeon: [name], MD;  Location: [facility];  Service: Orthopedics;  Laterality: Right;  with local   CHOLECYSTECTOMY     SHOULDER OPEN ROTATOR CUFF REPAIR  07/31/2017    Review of systems negative except as noted in HPI / PMHx or noted below:  Review of Systems  Constitutional: Negative.   HENT: Negative.    Eyes: Negative.   Respiratory: Negative.    Cardiovascular: Negative.   Gastrointestinal: Negative.   Genitourinary: Negative.   Musculoskeletal: Negative.   Skin: Negative.   Neurological: Negative.   Endo/Heme/Allergies: Negative.   Psychiatric/Behavioral: Negative.       Objective:   Vitals:   04/25/23 0914  BP: 118/84  Pulse: 80  Resp: 16  Temp: 97.9 F (36.6 C)  SpO2: 97%   Height: 5\' 8"  (172.7 cm)  Weight: 179 lb 14.4 oz (81.6 kg)   Physical Exam Constitutional:      Appearance: He is not diaphoretic.  HENT:     Head: Normocephalic.     Right Ear: Tympanic membrane, ear canal and external ear normal.     Left Ear: Tympanic membrane, ear canal and external ear normal.     Nose: Nose normal. No mucosal edema or rhinorrhea.     Mouth/Throat:     Pharynx: Uvula midline. No oropharyngeal exudate.  Eyes:     Conjunctiva/sclera: Conjunctivae normal.  Neck:     Thyroid: No thyromegaly.     Trachea: Trachea normal. No tracheal tenderness or tracheal deviation.  Cardiovascular:     Rate and Rhythm: Normal rate and regular rhythm.     Heart sounds: Normal heart sounds, S1 normal and S2 normal. No murmur heard. Pulmonary:     Effort: No respiratory  distress.     Breath sounds: Normal breath sounds. No stridor. No wheezing or rales.  Lymphadenopathy:     Head:     Right side of head: No tonsillar adenopathy.     Left side of head: No tonsillar adenopathy.     Cervical: No cervical adenopathy.  Skin:    Findings: No erythema or rash.     Nails: There is no clubbing.  Neurological:     Mental Status: He is alert.     Diagnostics: Spirometry was performed.  His FEV1 was 3.22 which is 104% predicted  Assessment and Plan:   1. Asthma, severe persistent, well-controlled   2. Other atopic dermatitis   3. Perennial allergic rhinitis   4. LPRD (laryngopharyngeal reflux disease)    1. Continue Dupilumab injections  2. Continue omeprazole 40 mg - 1 tablet 1 time per day  3. If needed:   A. OTC antihistamine  B. Epi-Pen  4. Influenza = Tamiflu. Covid = Paxlovid  5. Return to clinic in 1 year or earlier if needed  Douglas Carroll appears to be doing very well and he will remain on dupilumab.  I do not think there is any need for him to utilize a short acting bronchodilator as he has not required this agent in several years.  I am not really sure he needs an EpiPen as well as he can now eat shrimp without any problem but he feels safer if he can have an EpiPen.  His reflux appears to be under control with PPI.  Will see him back in this clinic in 1 year.  Laurette Schimke, MD Allergy / Immunology [facility]

## 2023-04-25 NOTE — Patient Instructions (Addendum)
  1. Continue Dupilumab injections  2. Continue omeprazole 40 mg - 1 tablet 1 time per day  3. If needed:   A. OTC antihistamine  B. Epi-Pen  4. Influenza = Tamiflu. Covid = Paxlovid  5. Return to clinic in 1 year or earlier if needed

## 2023-05-01 ENCOUNTER — Encounter: Payer: Self-pay | Admitting: Allergy and Immunology

## 2023-05-03 ENCOUNTER — Other Ambulatory Visit: Payer: Self-pay | Admitting: *Deleted

## 2023-05-03 MED ORDER — DUPIXENT 300 MG/2ML ~~LOC~~ SOSY: 300.0000 mg | PREFILLED_SYRINGE | SUBCUTANEOUS | 11 refills | Status: AC

## 2023-05-04 DIAGNOSIS — R972 Elevated prostate specific antigen [PSA]: Secondary | ICD-10-CM | POA: Diagnosis not present

## 2023-05-11 DIAGNOSIS — N401 Enlarged prostate with lower urinary tract symptoms: Secondary | ICD-10-CM | POA: Diagnosis not present

## 2023-05-11 DIAGNOSIS — R972 Elevated prostate specific antigen [PSA]: Secondary | ICD-10-CM | POA: Diagnosis not present

## 2023-05-11 DIAGNOSIS — R3912 Poor urinary stream: Secondary | ICD-10-CM | POA: Diagnosis not present

## 2023-06-15 ENCOUNTER — Telehealth: Payer: Self-pay | Admitting: *Deleted

## 2023-06-15 NOTE — Telephone Encounter (Signed)
Patient called and advised delay in getting Dupixent form PAP. He has filled out paperwork and resent to them I advised he can pick up sample from clinic

## 2023-07-13 ENCOUNTER — Telehealth: Payer: Self-pay | Admitting: *Deleted

## 2023-07-13 NOTE — Telephone Encounter (Signed)
 Patient called and advised dupixent my way hasn ot received forms for re-enrollment for 2025 and requesting help l/m for patient to contact me to discuss

## 2023-09-13 DIAGNOSIS — H524 Presbyopia: Secondary | ICD-10-CM | POA: Diagnosis not present

## 2023-10-11 DIAGNOSIS — K080 Exfoliation of teeth due to systemic causes: Secondary | ICD-10-CM | POA: Diagnosis not present

## 2023-10-18 DIAGNOSIS — K219 Gastro-esophageal reflux disease without esophagitis: Secondary | ICD-10-CM | POA: Diagnosis not present

## 2023-10-18 DIAGNOSIS — F418 Other specified anxiety disorders: Secondary | ICD-10-CM | POA: Diagnosis not present

## 2023-10-18 DIAGNOSIS — E7800 Pure hypercholesterolemia, unspecified: Secondary | ICD-10-CM | POA: Diagnosis not present

## 2023-10-18 DIAGNOSIS — Z79899 Other long term (current) drug therapy: Secondary | ICD-10-CM | POA: Diagnosis not present

## 2024-04-10 DIAGNOSIS — F418 Other specified anxiety disorders: Secondary | ICD-10-CM | POA: Diagnosis not present

## 2024-04-10 DIAGNOSIS — Z1331 Encounter for screening for depression: Secondary | ICD-10-CM | POA: Diagnosis not present

## 2024-04-10 DIAGNOSIS — Z9181 History of falling: Secondary | ICD-10-CM | POA: Diagnosis not present

## 2024-04-10 DIAGNOSIS — Z79899 Other long term (current) drug therapy: Secondary | ICD-10-CM | POA: Diagnosis not present

## 2024-04-10 DIAGNOSIS — E7800 Pure hypercholesterolemia, unspecified: Secondary | ICD-10-CM | POA: Diagnosis not present

## 2024-04-10 DIAGNOSIS — K219 Gastro-esophageal reflux disease without esophagitis: Secondary | ICD-10-CM | POA: Diagnosis not present

## 2024-04-23 ENCOUNTER — Ambulatory Visit: Payer: Medicare Other | Admitting: Allergy and Immunology

## 2024-04-23 ENCOUNTER — Encounter: Payer: Self-pay | Admitting: Allergy and Immunology

## 2024-04-23 ENCOUNTER — Other Ambulatory Visit: Payer: Self-pay

## 2024-04-23 VITALS — BP 112/64 | HR 74 | Temp 97.7°F | Resp 16 | Ht 68.75 in | Wt 180.2 lb

## 2024-04-23 DIAGNOSIS — K219 Gastro-esophageal reflux disease without esophagitis: Secondary | ICD-10-CM

## 2024-04-23 DIAGNOSIS — J3089 Other allergic rhinitis: Secondary | ICD-10-CM

## 2024-04-23 DIAGNOSIS — L2089 Other atopic dermatitis: Secondary | ICD-10-CM

## 2024-04-23 DIAGNOSIS — J4550 Severe persistent asthma, uncomplicated: Secondary | ICD-10-CM | POA: Diagnosis not present

## 2024-04-23 MED ORDER — EPINEPHRINE 0.3 MG/0.3ML IJ SOAJ: 0.3000 mg | INTRAMUSCULAR | 1 refills | Status: AC | PRN

## 2024-04-23 MED ORDER — OMEPRAZOLE 40 MG PO CPDR: 40.0000 mg | DELAYED_RELEASE_CAPSULE | Freq: Every day | ORAL | 3 refills | Status: AC

## 2024-04-23 NOTE — Patient Instructions (Signed)
  1. Continue Dupilumab injections  2. Continue omeprazole 40 mg - 1 tablet 1 time per day  3. If needed:   A. OTC antihistamine  B. Epi-Pen  4. Influenza = Tamiflu. Covid = Paxlovid  5. Return to clinic in 1 year or earlier if needed

## 2024-04-23 NOTE — Progress Notes (Signed)
 "  [place] - [place] - [place] - [place] - [place]   Follow-up Note  Referring Provider: No ref. provider found Primary Provider: Pcp, No Date of Office Visit: 04/23/2024  Subjective:   Douglas Carroll (DOB: [birth year]) is a 68 y.o. male who [facility] in re-evaluation of the following:  HPI: Darina returns to this clinic in evaluation of asthma, allergic rhinoconjunctivitis, severe atopic dermatitis, history of chronic urticaria, and history of LPR.  Last saw him in this clinic 25 April 2023.  Once again while using dupilumab  he has done excellent regarding his skin and he does not use any topical agents at this point in time.  Likewise, he has had no respiratory tract issues and does not use any medications for either his upper or lower airway.  He has not used the short acting bronchodilator in years and he can exert himself with no difficulty at all.  He still carries an EpiPen  for his history of urticaria and shrimp allergy.  Allergies as of 04/23/2024       Reactions   Mixed Ragweed Rash   Cat Dander Itching        Medication List    ALPRAZolam 1 MG tablet Commonly known as: XANAX alprazolam 1 mg tablet  TAKE 1 TABLET BY MOUTH DAILY AS NEEDED FOR ACUTE ANXIETY   atorvastatin 80 MG tablet Commonly known as: LIPITOR Take 80 mg by mouth daily.   Dupixent  300 MG/2ML prefilled syringe Generic drug: dupilumab  Inject 300 mg into the skin every 14 (fourteen) days. Q 2 weeks for eczema and asthma   EPINEPHrine  0.3 mg/0.3 mL Soaj injection Commonly known as: EpiPen  2-Pak Inject 0.3 mg into the muscle as needed for anaphylaxis.   levocetirizine 5 MG tablet Commonly known as: XYZAL  Take 1 tablet (5 mg total) by mouth daily as needed (Can take an extra dose during flare ups.).   omeprazole  40 MG capsule Commonly known as: PRILOSEC Take 1 capsule (40 mg total) by mouth daily in the afternoon.   pravastatin 40  MG tablet Commonly known as: PRAVACHOL 1 tablet Orally Once a day; Duration: 30 day(s)   sertraline 50 MG tablet Commonly known as: ZOLOFT Take 50 mg by mouth daily.    Past Medical History:  Diagnosis Date   Allergic rhinitis    Arthritis    Asthma    Depression    Eczema    GERD (gastroesophageal reflux disease)    Lumbar herniated disc    Right carpal tunnel syndrome    Urticaria     Past Surgical History:  Procedure Laterality Date   ANKLE SURGERY     CARPAL TUNNEL RELEASE Right 05/31/2021   Procedure: CARPAL TUNNEL RELEASE;  Surgeon: Alyse Agent, MD;  Location: [facility] [place];  Service: Orthopedics;  Laterality: Right;  with local   CHOLECYSTECTOMY     SHOULDER OPEN ROTATOR CUFF REPAIR  07/31/2017    Review of systems negative except as noted in HPI / PMHx or noted below:  Review of Systems  Constitutional: Negative.   HENT: Negative.    Eyes: Negative.   Respiratory: Negative.    Cardiovascular: Negative.   Gastrointestinal: Negative.   Genitourinary: Negative.   Musculoskeletal: Negative.   Skin: Negative.   Neurological: Negative.   Endo/Heme/Allergies: Negative.   Psychiatric/Behavioral: Negative.       Objective:   Vitals:   04/23/24 1045  BP: 112/64  Pulse: 74  Resp: 16  Temp: 97.7 F (36.5 C)  SpO2: 97%   Height: 5' 8.75 (174.6 cm)  Weight: 180 lb 3.2 oz (81.7 kg)   Physical Exam Constitutional:      Appearance: He is not diaphoretic.  HENT:     Head: Normocephalic.     Right Ear: Tympanic membrane, ear canal and external ear normal.     Left Ear: Tympanic membrane, ear canal and external ear normal.     Nose: Nose normal. No mucosal edema or rhinorrhea.     Mouth/Throat:     Pharynx: Uvula midline. No oropharyngeal exudate.  Eyes:     Conjunctiva/sclera: Conjunctivae normal.  Neck:     Thyroid: No thyromegaly.     Trachea: Trachea normal. No tracheal tenderness or tracheal deviation.  Cardiovascular:     Rate  and Rhythm: Normal rate and regular rhythm.     Heart sounds: Normal heart sounds, S1 normal and S2 normal. No murmur heard. Pulmonary:     Effort: No respiratory distress.     Breath sounds: Normal breath sounds. No stridor. No wheezing or rales.  Lymphadenopathy:     Head:     Right side of head: No tonsillar adenopathy.     Left side of head: No tonsillar adenopathy.     Cervical: No cervical adenopathy.  Skin:    Findings: No erythema or rash.     Nails: There is no clubbing.  Neurological:     Mental Status: He is alert.     Diagnostics:  none  Assessment and Plan:   1. Asthma, severe persistent, well-controlled (HCC)   2. Other atopic dermatitis   3. Perennial allergic rhinitis   4. LPRD (laryngopharyngeal reflux disease)     1. Continue Dupilumab  injections  2. Continue omeprazole  40 mg - 1 tablet 1 time per day  3. If needed:   A. OTC antihistamine  B. Epi-Pen  4. Influenza = Tamiflu. Covid = Paxlovid  5. Return to clinic in 1 year or earlier if needed  Darina is doing amazing well while using his dupilumab  injections and they have basically taken care of his multiorgan atopic disease and he has no issues at all at this point in time and does not require any medications to address his atopic disease other than the use of dupilumab .  And his reflux is under very good control while using omeprazole  on a consistent basis.  Assuming he does well we will see him back in this clinic in 1 year or earlier if there is a problem.  Camellia Denis, MD Allergy / Immunology [facility] "

## 2024-04-29 ENCOUNTER — Encounter: Payer: Self-pay | Admitting: Allergy and Immunology

## 2025-04-22 ENCOUNTER — Ambulatory Visit: Admitting: Allergy and Immunology
